# Patient Record
Sex: Male | Born: 1960
Health system: Southern US, Community
[De-identification: ages and names within clinical notes are randomized; demographics above are authoritative.]

## PROBLEM LIST (undated history)

## (undated) DIAGNOSIS — K603 Anal fistula: Secondary | ICD-10-CM

## (undated) DIAGNOSIS — Z7289 Other problems related to lifestyle: Secondary | ICD-10-CM

## (undated) DIAGNOSIS — R609 Edema, unspecified: Secondary | ICD-10-CM

## (undated) DIAGNOSIS — F1290 Cannabis use, unspecified, uncomplicated: Secondary | ICD-10-CM

## (undated) DIAGNOSIS — Z720 Tobacco use: Secondary | ICD-10-CM

## (undated) DIAGNOSIS — I10 Essential (primary) hypertension: Secondary | ICD-10-CM

## (undated) HISTORY — PX: HEMORRHOID SURGERY: SHX153

---
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
## 1997-01-01 DIAGNOSIS — K603 Anal fistula, unspecified: Secondary | ICD-10-CM

## 1997-01-01 HISTORY — DX: Anal fistula, unspecified: K60.30

## 1997-01-01 HISTORY — DX: Anal fistula: K60.3

## 2010-09-04 ENCOUNTER — Ambulatory Visit (INDEPENDENT_AMBULATORY_CARE_PROVIDER_SITE_OTHER): Payer: Self-pay

## 2010-09-04 ENCOUNTER — Inpatient Hospital Stay (HOSPITAL_COMMUNITY)
Admission: RE | Admit: 2010-09-04 | Discharge: 2010-09-04 | Disposition: A | Discharge status: Home / Self Care | Payer: Self-pay | Source: Ambulatory Visit | Attending: Family Medicine | Admitting: Family Medicine

## 2010-09-04 DIAGNOSIS — IMO0002 Reserved for concepts with insufficient information to code with codable children: Secondary | ICD-10-CM

## 2013-04-28 ENCOUNTER — Ambulatory Visit: Payer: Self-pay | Attending: Internal Medicine

## 2018-10-02 DIAGNOSIS — R609 Edema, unspecified: Secondary | ICD-10-CM

## 2018-10-02 DIAGNOSIS — F1090 Alcohol use, unspecified, uncomplicated: Secondary | ICD-10-CM

## 2018-10-02 DIAGNOSIS — Z7289 Other problems related to lifestyle: Secondary | ICD-10-CM

## 2018-10-02 DIAGNOSIS — F1290 Cannabis use, unspecified, uncomplicated: Secondary | ICD-10-CM

## 2018-10-02 DIAGNOSIS — R600 Localized edema: Secondary | ICD-10-CM

## 2018-10-02 DIAGNOSIS — I10 Essential (primary) hypertension: Secondary | ICD-10-CM

## 2018-10-02 DIAGNOSIS — Z789 Other specified health status: Secondary | ICD-10-CM

## 2018-10-02 DIAGNOSIS — Z720 Tobacco use: Secondary | ICD-10-CM

## 2018-10-02 HISTORY — DX: Alcohol use, unspecified, uncomplicated: F10.90

## 2018-10-02 HISTORY — DX: Other problems related to lifestyle: Z72.89

## 2018-10-02 HISTORY — DX: Other specified health status: Z78.9

## 2018-10-02 HISTORY — DX: Localized edema: R60.0

## 2018-10-02 HISTORY — DX: Edema, unspecified: R60.9

## 2018-10-02 HISTORY — DX: Essential (primary) hypertension: I10

## 2018-10-02 HISTORY — DX: Cannabis use, unspecified, uncomplicated: F12.90

## 2018-10-02 HISTORY — DX: Tobacco use: Z72.0

## 2018-10-06 ENCOUNTER — Other Ambulatory Visit: Payer: Self-pay

## 2018-10-06 ENCOUNTER — Encounter (HOSPITAL_COMMUNITY): Payer: Self-pay

## 2018-10-06 DIAGNOSIS — Y92009 Unspecified place in unspecified non-institutional (private) residence as the place of occurrence of the external cause: Secondary | ICD-10-CM

## 2018-10-06 DIAGNOSIS — K2970 Gastritis, unspecified, without bleeding: Secondary | ICD-10-CM | POA: Diagnosis present

## 2018-10-06 DIAGNOSIS — E860 Dehydration: Secondary | ICD-10-CM | POA: Diagnosis present

## 2018-10-06 DIAGNOSIS — F1020 Alcohol dependence, uncomplicated: Secondary | ICD-10-CM | POA: Diagnosis present

## 2018-10-06 DIAGNOSIS — T510X1A Toxic effect of ethanol, accidental (unintentional), initial encounter: Secondary | ICD-10-CM | POA: Diagnosis present

## 2018-10-06 DIAGNOSIS — D689 Coagulation defect, unspecified: Secondary | ICD-10-CM | POA: Diagnosis present

## 2018-10-06 DIAGNOSIS — T391X1A Poisoning by 4-Aminophenol derivatives, accidental (unintentional), initial encounter: Principal | ICD-10-CM | POA: Diagnosis present

## 2018-10-06 DIAGNOSIS — K7040 Alcoholic hepatic failure without coma: Secondary | ICD-10-CM | POA: Diagnosis present

## 2018-10-06 DIAGNOSIS — K7110 Toxic liver disease with hepatic necrosis, without coma: Secondary | ICD-10-CM | POA: Diagnosis present

## 2018-10-06 DIAGNOSIS — K029 Dental caries, unspecified: Secondary | ICD-10-CM | POA: Diagnosis present

## 2018-10-06 DIAGNOSIS — F1210 Cannabis abuse, uncomplicated: Secondary | ICD-10-CM | POA: Diagnosis present

## 2018-10-06 DIAGNOSIS — N179 Acute kidney failure, unspecified: Secondary | ICD-10-CM | POA: Diagnosis present

## 2018-10-06 DIAGNOSIS — I10 Essential (primary) hypertension: Secondary | ICD-10-CM | POA: Diagnosis present

## 2018-10-06 DIAGNOSIS — K920 Hematemesis: Secondary | ICD-10-CM | POA: Diagnosis present

## 2018-10-06 DIAGNOSIS — K047 Periapical abscess without sinus: Secondary | ICD-10-CM | POA: Diagnosis present

## 2018-10-06 DIAGNOSIS — Z20828 Contact with and (suspected) exposure to other viral communicable diseases: Secondary | ICD-10-CM | POA: Diagnosis present

## 2018-10-06 DIAGNOSIS — E872 Acidosis: Secondary | ICD-10-CM | POA: Diagnosis present

## 2018-10-06 NOTE — ED Triage Notes (Signed)
Pt arrived stating he has had an abscessed tooth for the last year but has not had it fixed due to cost, states it became worse last night and began vomiting today. Tooth broke off on Tuesday. Left lower jaw swelling, pain. ETOH on board, managing pain at home with tylenol

## 2018-10-07 ENCOUNTER — Encounter (HOSPITAL_COMMUNITY): Payer: Self-pay

## 2018-10-07 ENCOUNTER — Inpatient Hospital Stay (HOSPITAL_COMMUNITY)
Admission: EM | Admit: 2018-10-07 | Discharge: 2018-10-10 | DRG: 918 | Disposition: A | Discharge status: Home / Self Care | Payer: Self-pay | Attending: Internal Medicine | Admitting: Internal Medicine

## 2018-10-07 ENCOUNTER — Inpatient Hospital Stay (HOSPITAL_COMMUNITY): Payer: Self-pay

## 2018-10-07 ENCOUNTER — Telehealth (HOSPITAL_COMMUNITY): Payer: Self-pay | Admitting: Radiology

## 2018-10-07 DIAGNOSIS — R1013 Epigastric pain: Secondary | ICD-10-CM

## 2018-10-07 DIAGNOSIS — K7200 Acute and subacute hepatic failure without coma: Secondary | ICD-10-CM

## 2018-10-07 DIAGNOSIS — R7401 Elevation of levels of liver transaminase levels: Secondary | ICD-10-CM | POA: Diagnosis present

## 2018-10-07 DIAGNOSIS — F1910 Other psychoactive substance abuse, uncomplicated: Secondary | ICD-10-CM

## 2018-10-07 DIAGNOSIS — T391X1A Poisoning by 4-Aminophenol derivatives, accidental (unintentional), initial encounter: Secondary | ICD-10-CM

## 2018-10-07 DIAGNOSIS — K047 Periapical abscess without sinus: Secondary | ICD-10-CM

## 2018-10-07 LAB — COMPREHENSIVE METABOLIC PANEL
ALT: 10188 U/L — ABNORMAL HIGH (ref 0–44)
ALT: 8773 U/L — ABNORMAL HIGH (ref 0–44)
ALT: 8927 U/L — ABNORMAL HIGH (ref 0–44)
AST: >10000 U/L — ABNORMAL HIGH (ref 15–41)
AST: 9128 U/L — ABNORMAL HIGH (ref 15–41)
AST: 8982 U/L — ABNORMAL HIGH (ref 15–41)
Albumin: 3.9 g/dL (ref 3.5–5.0)
Albumin: 2.9 g/dL — ABNORMAL LOW (ref 3.5–5.0)
Albumin: 3.2 g/dL — ABNORMAL LOW (ref 3.5–5.0)
Alkaline Phosphatase: 127 U/L — ABNORMAL HIGH (ref 38–126)
Alkaline Phosphatase: 93 U/L (ref 38–126)
Alkaline Phosphatase: 93 U/L (ref 38–126)
Anion gap: 13 (ref 5–15)
Anion gap: 19 — ABNORMAL HIGH (ref 5–15)
Anion gap: 12 (ref 5–15)
BUN: 35 mg/dL — ABNORMAL HIGH (ref 6–20)
BUN: 35 mg/dL — ABNORMAL HIGH (ref 6–20)
BUN: 37 mg/dL — ABNORMAL HIGH (ref 6–20)
CO2: 24 mmol/L (ref 22–32)
CO2: 16 mmol/L — ABNORMAL LOW (ref 22–32)
CO2: 23 mmol/L (ref 22–32)
Calcium: 10.7 mg/dL — ABNORMAL HIGH (ref 8.9–10.3)
Calcium: 8.8 mg/dL — ABNORMAL LOW (ref 8.9–10.3)
Calcium: 9 mg/dL (ref 8.9–10.3)
Chloride: 100 mmol/L (ref 98–111)
Chloride: 103 mmol/L (ref 98–111)
Chloride: 102 mmol/L (ref 98–111)
Creatinine, Ser: 2.21 mg/dL — ABNORMAL HIGH (ref 0.61–1.24)
Creatinine, Ser: 2.1 mg/dL — ABNORMAL HIGH (ref 0.61–1.24)
Creatinine, Ser: 1.99 mg/dL — ABNORMAL HIGH (ref 0.61–1.24)
GFR calc Af Amer: 37 mL/min — ABNORMAL LOW (ref >60)
GFR calc Af Amer: 40 mL/min — ABNORMAL LOW (ref >60)
GFR calc Af Amer: 42 mL/min — ABNORMAL LOW (ref >60)
GFR calc non Af Amer: 32 mL/min — ABNORMAL LOW (ref >60)
GFR calc non Af Amer: 34 mL/min — ABNORMAL LOW (ref >60)
GFR calc non Af Amer: 36 mL/min — ABNORMAL LOW (ref >60)
Glucose, Bld: 88 mg/dL (ref 70–99)
Glucose, Bld: 119 mg/dL — ABNORMAL HIGH (ref 70–99)
Glucose, Bld: 92 mg/dL (ref 70–99)
Potassium: 4.6 mmol/L (ref 3.5–5.1)
Potassium: 4.3 mmol/L (ref 3.5–5.1)
Potassium: 4 mmol/L (ref 3.5–5.1)
Sodium: 137 mmol/L (ref 135–145)
Sodium: 138 mmol/L (ref 135–145)
Sodium: 137 mmol/L (ref 135–145)
Total Bilirubin: 4.9 mg/dL — ABNORMAL HIGH (ref 0.3–1.2)
Total Bilirubin: 3.8 mg/dL — ABNORMAL HIGH (ref 0.3–1.2)
Total Bilirubin: 3.8 mg/dL — ABNORMAL HIGH (ref 0.3–1.2)
Total Protein: 7.1 g/dL (ref 6.5–8.1)
Total Protein: 5.7 g/dL — ABNORMAL LOW (ref 6.5–8.1)
Total Protein: 5.9 g/dL — ABNORMAL LOW (ref 6.5–8.1)

## 2018-10-07 LAB — URINALYSIS, ROUTINE W REFLEX MICROSCOPIC
Glucose, UA: NEGATIVE mg/dL
Ketones, ur: NEGATIVE mg/dL
Leukocytes,Ua: NEGATIVE
Nitrite: NEGATIVE
Protein, ur: 100 mg/dL — AB
Specific Gravity, Urine: 1.02 (ref 1.005–1.030)
pH: 5 (ref 5.0–8.0)

## 2018-10-07 LAB — CBC
HCT: 51.3 % (ref 39.0–52.0)
HCT: 44.4 % (ref 39.0–52.0)
Hemoglobin: 17.2 g/dL — ABNORMAL HIGH (ref 13.0–17.0)
Hemoglobin: 14.8 g/dL (ref 13.0–17.0)
MCH: 32.1 pg (ref 26.0–34.0)
MCH: 32.2 pg (ref 26.0–34.0)
MCHC: 33.5 g/dL (ref 30.0–36.0)
MCHC: 33.3 g/dL (ref 30.0–36.0)
MCV: 95.9 fL (ref 80.0–100.0)
MCV: 96.5 fL (ref 80.0–100.0)
Platelets: 188 10*3/uL (ref 150–400)
Platelets: 164 10*3/uL (ref 150–400)
RBC: 5.35 MIL/uL (ref 4.22–5.81)
RBC: 4.6 MIL/uL (ref 4.22–5.81)
RDW: 12.3 % (ref 11.5–15.5)
RDW: 12.3 % (ref 11.5–15.5)
WBC: 14 10*3/uL — ABNORMAL HIGH (ref 4.0–10.5)
WBC: 11.4 10*3/uL — ABNORMAL HIGH (ref 4.0–10.5)
nRBC: 0 % (ref 0.0–0.2)
nRBC: 0 % (ref 0.0–0.2)

## 2018-10-07 LAB — BLOOD GAS, VENOUS
Acid-base deficit: 0.8 mmol/L (ref 0.0–2.0)
Bicarbonate: 23.7 mmol/L (ref 20.0–28.0)
O2 Saturation: 89.5 %
Patient temperature: 98.6
pCO2, Ven: 40.9 mmHg — ABNORMAL LOW (ref 44.0–60.0)
pH, Ven: 7.381 (ref 7.250–7.430)
pO2, Ven: 61.8 mmHg — ABNORMAL HIGH (ref 32.0–45.0)

## 2018-10-07 LAB — PHOSPHORUS: Phosphorus: 2.6 mg/dL (ref 2.5–4.6)

## 2018-10-07 LAB — MAGNESIUM: Magnesium: 1.8 mg/dL (ref 1.7–2.4)

## 2018-10-07 LAB — PROTIME-INR
INR: 2.8 — ABNORMAL HIGH (ref 0.8–1.2)
INR: 2.6 — ABNORMAL HIGH (ref 0.8–1.2)
Prothrombin Time: 29.1 seconds — ABNORMAL HIGH (ref 11.4–15.2)
Prothrombin Time: 27.4 seconds — ABNORMAL HIGH (ref 11.4–15.2)

## 2018-10-07 LAB — HEPATITIS PANEL, ACUTE
HCV Ab: NONREACTIVE
Hep A IgM: NONREACTIVE
Hep B C IgM: NONREACTIVE
Hepatitis B Surface Ag: NONREACTIVE

## 2018-10-07 LAB — RAPID URINE DRUG SCREEN, HOSP PERFORMED
Amphetamines: NOT DETECTED
Barbiturates: NOT DETECTED
Benzodiazepines: NOT DETECTED
Cocaine: NOT DETECTED
Opiates: NOT DETECTED
Tetrahydrocannabinol: POSITIVE — AB

## 2018-10-07 LAB — AMMONIA
Ammonia: 83 umol/L — ABNORMAL HIGH (ref 9–35)
Ammonia: 59 umol/L — ABNORMAL HIGH (ref 9–35)

## 2018-10-07 LAB — URINE CULTURE: Culture: <10000 — AB

## 2018-10-07 LAB — ETHANOL: Alcohol, Ethyl (B): 10 mg/dL — ABNORMAL HIGH (ref <10)

## 2018-10-07 LAB — LIPASE, BLOOD: Lipase: 652 U/L — ABNORMAL HIGH (ref 11–51)

## 2018-10-07 LAB — MRSA PCR SCREENING: MRSA by PCR: NEGATIVE

## 2018-10-07 LAB — SARS CORONAVIRUS 2 BY RT PCR (HOSPITAL ORDER, PERFORMED IN ~~LOC~~ HOSPITAL LAB): SARS Coronavirus 2: NEGATIVE

## 2018-10-07 LAB — HIV ANTIBODY (ROUTINE TESTING W REFLEX): HIV Screen 4th Generation wRfx: NONREACTIVE

## 2018-10-07 LAB — LACTIC ACID, PLASMA: Lactic Acid, Venous: 2.2 mmol/L (ref 0.5–1.9)

## 2018-10-07 LAB — SALICYLATE LEVEL: Salicylate Lvl: 7.4 mg/dL (ref 2.8–30.0)

## 2018-10-07 LAB — ACETAMINOPHEN LEVEL: Acetaminophen (Tylenol), Serum: <10 ug/mL — ABNORMAL LOW (ref 10–30)

## 2018-10-07 MED ORDER — SODIUM CHLORIDE 0.9 % IV SOLN
1.0000 g | Freq: Once | INTRAVENOUS | Status: AC
  Administered 2018-10-07: 06:00:00 1 g via INTRAVENOUS
  Filled 2018-10-07: qty 10

## 2018-10-07 MED ORDER — DEXTROSE 5 % IV SOLN
15.0000 mg/kg/h | INTRAVENOUS | Status: DC
  Filled 2018-10-07: qty 120

## 2018-10-07 MED ORDER — AMLODIPINE BESYLATE 5 MG PO TABS
5.0000 mg | ORAL_TABLET | Freq: Every day | ORAL | Status: DC
  Administered 2018-10-07 – 2018-10-10 (×4): 5 mg via ORAL
  Filled 2018-10-07 (×4): qty 1

## 2018-10-07 MED ORDER — DEXTROSE 5 % IV SOLN
15.0000 mg/kg/h | INTRAVENOUS | Status: DC
  Administered 2018-10-07 – 2018-10-08 (×2): 15 mg/kg/h via INTRAVENOUS
  Filled 2018-10-07 (×4): qty 210

## 2018-10-07 MED ORDER — LIDOCAINE VISCOUS HCL 2 % MT SOLN
15.0000 mL | Freq: Once | OROMUCOSAL | Status: AC
  Administered 2018-10-07: 02:00:00 15 mL via OROMUCOSAL
  Filled 2018-10-07: qty 15

## 2018-10-07 MED ORDER — LORAZEPAM 2 MG/ML IJ SOLN: 1.0000 mg | INTRAMUSCULAR | Status: DC | PRN

## 2018-10-07 MED ORDER — ACETYLCYSTEINE LOAD VIA INFUSION: 150.0000 mg/kg | Freq: Once | INTRAVENOUS | Status: DC

## 2018-10-07 MED ORDER — AMOXICILLIN-POT CLAVULANATE 875-125 MG PO TABS
1.0000 | ORAL_TABLET | Freq: Once | ORAL | Status: AC
  Administered 2018-10-07: 02:00:00 1 via ORAL
  Filled 2018-10-07: qty 1

## 2018-10-07 MED ORDER — ACETYLCYSTEINE LOAD VIA INFUSION
150.0000 mg/kg | Freq: Once | INTRAVENOUS | Status: AC
  Administered 2018-10-07: 03:00:00 14220 mg via INTRAVENOUS
  Filled 2018-10-07: qty 356

## 2018-10-07 MED ORDER — ONDANSETRON 4 MG PO TBDP
4.0000 mg | ORAL_TABLET | Freq: Once | ORAL | Status: AC
  Administered 2018-10-07: 02:00:00 4 mg via ORAL
  Filled 2018-10-07: qty 1

## 2018-10-07 MED ORDER — SODIUM CHLORIDE 0.9 % IV SOLN: 1000.0000 mL | INTRAVENOUS | Status: DC

## 2018-10-07 MED ORDER — PROMETHAZINE HCL 25 MG/ML IJ SOLN
INTRAMUSCULAR | Status: AC
  Filled 2018-10-07: qty 1

## 2018-10-07 MED ORDER — ONDANSETRON HCL 4 MG/2ML IJ SOLN
4.0000 mg | Freq: Once | INTRAMUSCULAR | Status: AC
  Administered 2018-10-07: 05:00:00 4 mg via INTRAVENOUS
  Filled 2018-10-07: qty 2

## 2018-10-07 MED ORDER — PANTOPRAZOLE SODIUM 40 MG IV SOLR
40.0000 mg | Freq: Two times a day (BID) | INTRAVENOUS | Status: DC
  Administered 2018-10-07 – 2018-10-09 (×5): 40 mg via INTRAVENOUS
  Filled 2018-10-07 (×4): qty 40

## 2018-10-07 MED ORDER — SODIUM CHLORIDE 0.9 % IV BOLUS (SEPSIS)
1000.0000 mL | Freq: Once | INTRAVENOUS | Status: AC
  Administered 2018-10-07: 04:00:00 1000 mL via INTRAVENOUS

## 2018-10-07 MED ORDER — MORPHINE SULFATE (PF) 4 MG/ML IV SOLN
4.0000 mg | Freq: Once | INTRAVENOUS | Status: AC
  Administered 2018-10-07: 03:00:00 4 mg via INTRAVENOUS
  Filled 2018-10-07: qty 1

## 2018-10-07 MED ORDER — ONDANSETRON HCL 4 MG/2ML IJ SOLN: 4.0000 mg | Freq: Four times a day (QID) | INTRAMUSCULAR | Status: DC | PRN

## 2018-10-07 MED ORDER — LACTATED RINGERS IV SOLN
INTRAVENOUS | Status: DC
  Administered 2018-10-07 – 2018-10-09 (×4): via INTRAVENOUS

## 2018-10-07 MED ORDER — SODIUM CHLORIDE 0.9 % IV BOLUS
1000.0000 mL | Freq: Once | INTRAVENOUS | Status: AC
  Administered 2018-10-07: 03:00:00 1000 mL via INTRAVENOUS

## 2018-10-07 MED ORDER — HALOPERIDOL LACTATE 5 MG/ML IJ SOLN
2.0000 mg | Freq: Once | INTRAMUSCULAR | Status: DC
  Filled 2018-10-07: qty 1

## 2018-10-07 MED ORDER — METHYLPREDNISOLONE SODIUM SUCC 40 MG IJ SOLR
40.0000 mg | INTRAMUSCULAR | Status: DC
  Administered 2018-10-07 – 2018-10-08 (×2): 40 mg via INTRAVENOUS
  Filled 2018-10-07 (×2): qty 1

## 2018-10-07 MED ORDER — ORAL CARE MOUTH RINSE
15.0000 mL | Freq: Two times a day (BID) | OROMUCOSAL | Status: DC
  Administered 2018-10-07 – 2018-10-09 (×5): 15 mL via OROMUCOSAL

## 2018-10-07 MED ORDER — CHLORHEXIDINE GLUCONATE CLOTH 2 % EX PADS
6.0000 | MEDICATED_PAD | Freq: Every day | CUTANEOUS | Status: DC
  Administered 2018-10-08 – 2018-10-09 (×2): 6 via TOPICAL

## 2018-10-07 MED ORDER — PROMETHAZINE HCL 25 MG/ML IJ SOLN
25.0000 mg | Freq: Once | INTRAMUSCULAR | Status: AC
  Administered 2018-10-07: 25 mg via INTRAVENOUS

## 2018-10-07 MED ORDER — LIDOCAINE VISCOUS HCL 2 % MT SOLN
15.0000 mL | Freq: Once | OROMUCOSAL | Status: AC
  Administered 2018-10-07: 23:00:00 15 mL via OROMUCOSAL
  Filled 2018-10-07: qty 15

## 2018-10-07 MED ORDER — SODIUM CHLORIDE 0.9 % IV SOLN
250.0000 mL | INTRAVENOUS | Status: DC
  Administered 2018-10-07: 07:00:00 250 mL via INTRAVENOUS

## 2018-10-07 MED ORDER — AMOXICILLIN-POT CLAVULANATE 875-125 MG PO TABS
1.0000 | ORAL_TABLET | Freq: Two times a day (BID) | ORAL | Status: DC
  Administered 2018-10-07 – 2018-10-10 (×7): 1 via ORAL
  Filled 2018-10-07 (×7): qty 1

## 2018-10-07 MED ORDER — PANTOPRAZOLE SODIUM 40 MG IV SOLR
40.0000 mg | Freq: Once | INTRAVENOUS | Status: AC
  Administered 2018-10-07: 05:00:00 40 mg via INTRAVENOUS
  Filled 2018-10-07: qty 40

## 2018-10-07 MED ORDER — VITAMIN K1 10 MG/ML IJ SOLN
10.0000 mg | Freq: Once | INTRAVENOUS | Status: AC
  Administered 2018-10-07: 10 mg via INTRAVENOUS
  Filled 2018-10-07: qty 1

## 2018-10-07 NOTE — ED Provider Notes (Addendum)
[facility]-EMERGENCY DEPT Provider Note   CSN: [id number] Arrival date & time: 10/06/18  2224     History   Chief Complaint Chief Complaint  Patient presents with   Dental Pain    HPI Richard George is a 58 y.o. male presenting for evaluation of tooth pain and swelling.  Patient states since Thursday, he has had worsening tooth pain.  Due to his pain, he took excessive amounts of Tylenol, states 50 to 70 tablets of residual..  He has not had any Tylenol in the past 24 hours, as he started vomiting.  Patient states he has had some ibuprofen, but has not taken very much.  He also reports drinking some alcohol, but does not quantify how much.  Patient reports lots of pain in his tooth, which is causing headaches.  He also reports nausea and vomiting, unable to quantify how much.  He has vomited twice since being in the ER.  Emesis is nonbloody, and not coffee-ground.  Patient reports fever yesterday of 101, unknown if he has a fever today.  He denies difficulty swallowing or difficulty opening his mouth.  He denies cough, chest pain, abdominal pain, urinary symptoms, normal bowel movements.  He has no medical problems, takes no medications daily.     HPI  History reviewed. No pertinent past medical history.  There are no active problems to display for this patient.   History reviewed. No pertinent surgical history.      Home Medications    Prior to Admission medications   Medication Sig Start Date End Date Taking? Authorizing Provider  acetaminophen (TYLENOL) 500 MG tablet Take 1,000 mg by mouth every 6 (six) hours as needed for mild pain, moderate pain or headache.   Yes [provider]    Family History No family history on file.  Social History Social History   Tobacco Use   Smoking status: Not on file  Substance Use Topics   Alcohol use: Not on file   Drug use: Not on file     Allergies   Patient has no known  allergies.   Review of Systems Review of Systems  Constitutional: Positive for fever (yesterday).  HENT: Positive for dental problem and facial swelling.   Gastrointestinal: Positive for nausea and vomiting.  All other systems reviewed and are negative.    Physical Exam Updated Vital Signs BP (!) 157/108    Pulse (!) 103    Temp 97.9 F (36.6 C) (Oral)    Resp (!) 29    Ht  (1.803 m)    Wt 94.8 kg    SpO2 97%    BMI 29.15 kg/m   Physical Exam Vitals signs and nursing note reviewed.  Constitutional:      General: He is not in acute distress.    Appearance: He is well-developed.     Comments: Appears ill.  HENT:     Head: Normocephalic and atraumatic.      Comments: Left-sided facial swelling due to dental infection.    Mouth/Throat:     Lips: Pink.     Dentition: Abnormal dentition. Dental tenderness and dental caries present.      Comments: Overall poor dentition.  Left lower tooth cracked with surrounding gum erythema and edema.  No obvious abscess.  No trismus.  No pain under the tongue.  No swelling extending beyond the ramus of the mandible. Eyes:     Extraocular Movements: Extraocular movements intact.     Conjunctiva/sclera: Conjunctivae  normal.     Pupils: Pupils are equal, round, and reactive to light.  Neck:     Musculoskeletal: Normal range of motion and neck supple.  Cardiovascular:     Rate and Rhythm: Normal rate and regular rhythm.     Pulses: Normal pulses.  Pulmonary:     Effort: Pulmonary effort is normal. No respiratory distress.     Breath sounds: Normal breath sounds. No wheezing.  Abdominal:     General: There is no distension.     Palpations: Abdomen is soft. There is no mass.     Tenderness: There is no abdominal tenderness. There is no guarding or rebound.     Comments: No ttp of the abd. No rigidity, guarding, or distention. Negative rebound.   Musculoskeletal: Normal range of motion.  Skin:    General: Skin is warm and dry.      Capillary Refill: Capillary refill takes less than 2 seconds.  Neurological:     Mental Status: He is alert and oriented to person, place, and time.      ED Treatments / Results  Labs (all labs ordered are listed, but only abnormal results are displayed) Labs Reviewed  COMPREHENSIVE METABOLIC PANEL - Abnormal; Notable for the following components:      Result Value   BUN 35 (*)    Creatinine, Ser 2.21 (*)    Calcium 10.7 (*)    AST >10,000 (*)    ALT 10,188 (*)    Alkaline Phosphatase 127 (*)    Total Bilirubin 4.9 (*)    GFR calc non Af Amer 32 (*)    GFR calc Af Amer 37 (*)    All other components within normal limits  CBC - Abnormal; Notable for the following components:   WBC 14.0 (*)    Hemoglobin 17.2 (*)    All other components within normal limits  PROTIME-INR - Abnormal; Notable for the following components:   Prothrombin Time 29.1 (*)    INR 2.8 (*)    All other components within normal limits  RAPID URINE DRUG SCREEN, HOSP PERFORMED - Abnormal; Notable for the following components:   Tetrahydrocannabinol POSITIVE (*)    All other components within normal limits  ETHANOL - Abnormal; Notable for the following components:   Alcohol, Ethyl (B) 10 (*)    All other components within normal limits  ACETAMINOPHEN LEVEL - Abnormal; Notable for the following components:   Acetaminophen (Tylenol), Serum <10 (*)    All other components within normal limits  URINALYSIS, ROUTINE W REFLEX MICROSCOPIC - Abnormal; Notable for the following components:   Color, Urine AMBER (*)    APPearance CLOUDY (*)    Hgb urine dipstick MODERATE (*)    Bilirubin Urine SMALL (*)    Protein, ur 100 (*)    Bacteria, UA FEW (*)    All other components within normal limits  BLOOD GAS, VENOUS - Abnormal; Notable for the following components:   pCO2, Ven 40.9 (*)    pO2, Ven 61.8 (*)    All other components within normal limits  AMMONIA - Abnormal; Notable for the following components:    Ammonia 83 (*)    All other components within normal limits  SARS CORONAVIRUS 2 (HOSPITAL ORDER, [facility])  URINE CULTURE  SALICYLATE LEVEL    EKG EKG Interpretation  Date/Time:  Tuesday October 07 2018 02:47:50 EDT Ventricular Rate:  86 PR Interval:    QRS Duration: 93 QT Interval:  369 QTC Calculation: 442 R Axis:   72 Text Interpretation:  Sinus rhythm Probable left atrial enlargement Confirmed by Nicanor Alcon, April ([id number]) on 10/07/2018 2:51:41 AM   Radiology No results found.  Procedures .Critical Care Performed by: Alveria Apley, PA-C Authorized by: Alveria Apley, PA-C   Critical care provider statement:    Critical care time (minutes):  75   Critical care time was exclusive of:  Separately billable procedures and treating other patients and teaching time   Critical care was necessary to treat or prevent imminent or life-threatening deterioration of the following conditions:  Hepatic failure   Critical care was time spent personally by me on the following activities:  Blood draw for specimens, development of treatment plan with patient or surrogate, discussions with consultants, evaluation of patient's response to treatment, examination of patient, obtaining history from patient or surrogate, ordering and performing treatments and interventions, ordering and review of laboratory studies, ordering and review of radiographic studies, pulse oximetry, re-evaluation of patient's condition and review of old charts   I assumed direction of critical care for this patient from another provider in my specialty: no   Comments:     Pt with shock liver due to tylenol OD. NAC started. Vitamin K started. Pt admitted to critical care.    (including critical care time)  Medications Ordered in ED Medications  promethazine (PHENERGAN) 25 MG/ML injection (has no administration in time range)  phytonadione (VITAMIN K) 10 mg in dextrose 5 % 50 mL IVPB (10 mg  Intravenous New Bag/Given 10/07/18 0506)  sodium chloride 0.9 % bolus 1,000 mL (1,000 mLs Intravenous New Bag/Given 10/07/18 0428)    Followed by  0.9 %  sodium chloride infusion (has no administration in time range)  cefTRIAXone (ROCEPHIN) 1 g in sodium chloride 0.9 % 100 mL IVPB (has no administration in time range)  haloperidol lactate (HALDOL) injection 2 mg (has no administration in time range)  acetylcysteine (ACETADOTE) 42,000 mg in dextrose 5 % 1,050 mL (40 mg/mL) infusion (has no administration in time range)  amoxicillin-clavulanate (AUGMENTIN) 875-125 MG per tablet 1 tablet (1 tablet Oral Given 10/07/18 0134)  lidocaine (XYLOCAINE) 2 % viscous mouth solution 15 mL (15 mLs Mouth/Throat Given 10/07/18 0134)  ondansetron (ZOFRAN-ODT) disintegrating tablet 4 mg (4 mg Oral Given 10/07/18 0134)  acetylcysteine (ACETADOTE) 40 mg/mL load via infusion 14,220 mg (14,220 mg Intravenous Bolus from Bag 10/07/18 0252)  sodium chloride 0.9 % bolus 1,000 mL (0 mLs Intravenous Stopped 10/07/18 0425)  promethazine (PHENERGAN) injection 25 mg (25 mg Intravenous Given 10/07/18 0325)  morphine 4 MG/ML injection 4 mg (4 mg Intravenous Given 10/07/18 0325)  pantoprazole (PROTONIX) injection 40 mg (40 mg Intravenous Given 10/07/18 0444)  ondansetron (ZOFRAN) injection 4 mg (4 mg Intravenous Given 10/07/18 0443)     Initial Impression / Assessment and Plan / ED Course  I have reviewed the triage vital signs and the nursing notes.  Pertinent labs & imaging results that were available during my care of the patient were reviewed by me and considered in my medical decision making (see chart for details).        Patient presenting to the emergency department for dental pain.  Unfortunately, patient reports that he took excessive amounts of Tylenol, at least 70 pills in the past several days.  Patient reports nausea and vomiting over the past 24 hours.  I am concerned for liver issues in the setting of Tylenol  overdose.  Patient's dental pain not consistent with Ludwig's, does  not appear to be emergent at this time, as such will focus on tylenol OD tx/workup. Labs ordered for liver eval. Will consult with poison control.   Discussed with poison control who recommended starting NAC prior to Tylenol and liver values.  If both are normal, can consider discontinuing treatment.  Labs concerning for liver failure, AST and ALT greater than 10,000.  Bili 4.9.  Patient also with presumed AKI with a creatinine of 2.2, although no baseline.  INR also consistent with liver abnormalities, elevated at 2.8.  Will consult with GI and admit to critical care.  Discussed with Dr. Bosie ClosSchooler from GI who will consult on the patient.  Discussed with Dr. Levada SchillingSummers from critical care, PCCM will evaluate and admit the patient.  Unfortunately due to patient's alcohol abuse, not a candidate for liver transplant.   Discussed with poison control who recommends starting vitamin K for elevated INR and aggressive hydration.   Informed by RN that pt is more confused. I assessed pt, who is still alert and oriented, however he is vomiting dark blood-like emesis. protonix started. Haldol and zofran for nausea, as pt has had phenergan and zofran odt. Also consider etoh withdrawal complicating the picture in the setting of daily alcohol use.   Informed critical care of pt's worsening status, no change in plan. Pt's mom contacted and informed of pt's condition.   Discussed plan for admission with pt and mom. Pt's mom to call pt's children.  Final Clinical Impressions(s) / ED Diagnoses   Final diagnoses:  Accidental acetaminophen overdose, initial encounter  Acute liver failure without hepatic coma    ED Discharge Orders    None       Alveria ApleyCaccavale, Ulrich Soules, PA-C 10/07/18 0648    Alveria Apleyaccavale, Barton Want, PA-C 10/07/18 0650    Palumbo, April, MD 10/07/18 [phone]

## 2018-10-07 NOTE — ED Notes (Signed)
Date and time results received: 10/07/18 0119 (use smartphrase ".now" to insert current time)  Test: Lactic acid Critical Value: 2.2  Name of Provider Notified:  Orders Received? Or Actions Taken?: waiting on orders

## 2018-10-07 NOTE — Telephone Encounter (Signed)
Received call from pt's daughter multiple times today with concerns of being able to see her father. The family received a call from the ED stating that the patient was very ill and may not make it. They were advised to come to the hospital. The mother of the pt and two daughters's came to the hospital.The mother went in to see pt. They were told they could go in one at a time. They did that. At one point when 1 daughter was coming out to let the other daughter go in she was told, no they could not do that. They let the guy know at the front doing the COVID screening Trilby Drummer) that they could no longer do that and the person helping him at the [place] screening desk said that the nurse had said they could do that due to the pt's situation. Then Trilby Drummer stated, why what is so good about them that they get to do that, is he dead or what? This was said directly in front of both daughters as well as the other's in the lobby. The pt's daughter is extremely upset with the treatment they are receiving from the staff, understandably so. I told her that I would try to relay this information to the appropriate staff to see if it can be corrected ASAP. JM

## 2018-10-07 NOTE — Consult Note (Signed)
Subjective:   HPI  The patient is a 58 year old male who we are asked to see in consultation.  He presented to the emergency department at [facility] at around 11:00 last evening.  The patient states that last Thursday which would have been 5 days ago he started to have a really bad toothache.  He started taking Tylenol for the toothache.  He would take 6 or 8 at a time.  He did this up until Sunday and therefore he was taking excessive Tylenol over a 4-day period.  When I asked him exactly how much he took he could not tell me for sure but states it was a lot, and he kept taking it because it was not helping the pain.  He has a history of excessive alcohol use and has chronically drank a 9 pack of light beer for a long time.  He states he has not had any alcohol however in the past 6 days because he could not keep it down.  He has had some vomiting.  He states that mentally he has been clear.  Labs obtained here in the emergency room show WBCs 11.4, H&H 14.8 and 44.4 respectively.  Total bilirubin 3.8, alkaline phosphatase 93, AST 9128, ALT 8773, ammonia 83, prothrombin time 29.1, INR 2.8, EtOH level greater than 10.  Upon presentation to the emergency room it was felt that this patient had acute liver injury.  Given the history of excessive Tylenol ingestion acetylcysteine protocol was instituted.  Review of Systems No chest pain or shortness of breath History reviewed. No pertinent past medical history. History reviewed. No pertinent surgical history. Social History   Socioeconomic History  . Marital status: Divorced    Spouse name: Not on file  . Number of children: Not on file  . Years of education: Not on file  . Highest education level: Not on file  Occupational History  . Not on file  Social Needs  . Financial resource strain: Not on file  . Food insecurity    Worry: Not on file    Inability: Not on file  . Transportation needs    Medical: Not on file    Non-medical:  Not on file  Tobacco Use  . Smoking status: Not on file  Substance and Sexual Activity  . Alcohol use: Not on file  . Drug use: Not on file  . Sexual activity: Not on file  Lifestyle  . Physical activity    Days per week: Not on file    Minutes per session: Not on file  . Stress: Not on file  Relationships  . Social Herbalist on phone: Not on file    Gets together: Not on file    Attends religious service: Not on file    Active member of club or organization: Not on file    Attends meetings of clubs or organizations: Not on file    Relationship status: Not on file  . Intimate partner violence    Fear of current or ex partner: Not on file    Emotionally abused: Not on file    Physically abused: Not on file    Forced sexual activity: Not on file  Other Topics Concern  . Not on file  Social History Narrative  . Not on file   family history is not on file.  Current Facility-Administered Medications:  .  0.9 %  sodium chloride infusion, 250 mL, Intravenous, Continuous, Collier Bullock, MD, Last Rate: 15  mL/hr at 10/07/18 0658, 250 mL at 10/07/18 0658 .  acetylcysteine (ACETADOTE) 42,000 mg in dextrose 5 % 1,050 mL (40 mg/mL) infusion, 15 mg/kg/hr, Intravenous, Continuous, Palumbo, April, MD, Last Rate: 35.6 mL/hr at 10/07/18 0652, 15 mg/kg/hr at 10/07/18 6256 .  haloperidol lactate (HALDOL) injection 2 mg, 2 mg, Intravenous, Once, Caccavale, Sophia, PA-C .  LORazepam (ATIVAN) injection 1-2 mg, 1-2 mg, Intravenous, Q1H PRN, Charlotte Sanes, MD .  ondansetron [facility]) injection 4 mg, 4 mg, Intravenous, Q6H PRN, Charlotte Sanes, MD .  pantoprazole (PROTONIX) injection 40 mg, 40 mg, Intravenous, Q12H, Charlotte Sanes, MD  Current Outpatient Medications:  .  acetaminophen (TYLENOL) 500 MG tablet, Take 1,000 mg by mouth every 6 (six) hours as needed for mild pain, moderate pain or headache., Disp: , Rfl:  No Known Allergies   Objective:     BP (!) 144/107   Pulse 94    Temp 97.9 F (36.6 C) (Oral)   Resp 18   Ht 5\' 11"  (1.803 m)   Wt 94.8 kg   SpO2 98%   BMI 29.15 kg/m   He is somewhat jittery, but in no acute distress.  Mentally alert  Skin and sclera slightly icteric.  No spider angiomata seen on skin.  Heart regular rhythm no murmurs  Lungs clear  Abdomen: Bowel sounds present, soft, nontender, no hepatomegaly noted on exam or splenomegaly, and no pain in the right upper quadrant.  An abdominal ultrasound was done showing small amount of pericholecystic fluid otherwise unremarkable.  Normal sonographic appearance of the liver.  Laboratory No components found for: D1    Assessment:     Acute liver injury secondary to combination of alcohol and acetaminophen.      Plan:     Admit to critical care.  Supportive care.  Continue acetylcysteine protocol.  Avoidance of alcohol will be critical going forward.  Monitor LFTs and clinical course.  Watch for DTs.  Watch for hepatic encephalopathy, but I do not see any signs of either at this time.  Will check hepatitis panel.  He has several tattoos on his body.  Rule out underlying hepatitis C versus other viral hepatitides.

## 2018-10-07 NOTE — ED Notes (Signed)
Lab called back and said ammonia has to be on ice. I will recollect.

## 2018-10-07 NOTE — ED Notes (Signed)
Called lab and requested they add on ammonia and urine micro. They said they could

## 2018-10-07 NOTE — Progress Notes (Signed)
Day intensivist note  Seen and examined in ER. Appears nontoxic. Portal vein patent on [place]  A: Acute liver injury some combination of APAP and EtOH Acute kidney injury Anion gap acidosis  P: - Supportive care, NAC - Trend INR/LFTs - Check lactic acid, lipase - Start LR@ 125 cc/hr - f/u acute hepatitis panel - Diet when feels up to it - Will start steroids since no real downside, discriminant score 82, switch to PO when able to take  Erskine Emery MD

## 2018-10-07 NOTE — Progress Notes (Signed)
MEDICATION RELATED CONSULT NOTE   Pharmacy Consult for Acetylcysteine Indication: Recent Tylenol use, chronic ETOH abuse  No Known Allergies  Patient Measurements: Height: 5\' 11"  (180.3 cm) Weight: 209 lb (94.8 kg) IBW/kg (Calculated) : 75.3  Vital Signs: Temp: 97.9 F (36.6 C) (10/06 0340) Temp Source: Oral (10/06 0340) BP: 136/84 (10/06 1145) Pulse Rate: 83 (10/06 1145) Intake/Output from previous day: 10/05 0701 - 10/06 0700 In: 2038 [IV Piggyback:2038] Out: -  Intake/Output from this shift: No intake/output data recorded.  Labs: Recent Labs    10/07/18 0116 10/07/18 0642  WBC 14.0* 11.4*  HGB 17.2* 14.8  HCT 51.3 44.4  PLT 188 164  CREATININE 2.21* 2.10*  MG  --  1.8  PHOS  --  2.6  ALBUMIN 3.9 2.9*  PROT 7.1 5.7*  AST >10,000* 9,128*  ALT 10,188* 8,773*  ALKPHOS 127* 93  BILITOT 4.9* 3.8*   Estimated Creatinine Clearance: 46.2 mL/min (A) (by C-G formula based on SCr of 2.1 mg/dL (H)).  Medical History: History reviewed. No pertinent past medical history.  Medications:  Scheduled:  . haloperidol lactate  2 mg Intravenous Once  . methylPREDNISolone (SOLU-MEDROL) injection  40 mg Intravenous Q24H  . pantoprazole (PROTONIX) IV  40 mg Intravenous Q12H   Infusions:  . sodium chloride 250 mL (10/07/18 0658)  . acetylcysteine 15 mg/kg/hr (10/07/18 3762)  . lactated ringers 125 mL/hr at 10/07/18 1033   Assessment: Started Acetylcysteine 10/7 am in ED CPC updated with labs---no new orders, est. > 24hr LOT  ** entire 1st bag infused at bolus rate, started 0300. Patient received 2x calculated bolus ** 0715 CPC called Rx to let us know when to resume Acetylcysteine, recommended to resume infusion ~ 4 hr after infusion stopped. ** Per ED RN: pharmacy suggested resuming Acetylcysteine infusion - resumed 0600 or 0630am, before Peters had updated ** No sign of adverse effect, CPC noted anaphylaxis would be most common reaction seen. No reactions noted.  Baseline  INR 2.8 LFTs extremely elevated Tylenol level < 10, UDS positive for THC  Plan: Continuing Acetylcysteine infusion - CMET and INR ordered daily to 10/11   Minda Ditto PharmD 10/07/2018,12:04 PM

## 2018-10-07 NOTE — Progress Notes (Signed)
Of note also is that his acetaminophen level when drawn in the emergency room was not elevated.

## 2018-10-07 NOTE — Consult Note (Signed)
NAME:  Richard George, MRN:  [id number], DOB:  June 08, 1960, LOS: 0 ADMISSION DATE:  10/07/2018, CONSULTATION DATE:  10-6 REFERRING MD:  , CHIEF COMPLAINT: n/v  Brief History   58 year old man with hx of alcohol use, with acetaminophen toxicity, possible acute liver failure.   History of present illness   58 year old man with nausea vomiting, large recent doses acetaminophen for tooth pain  (50-70 tablets?).   None in past 24 hours.  ETOH use.  Nausea x  Fever yesterday.   Per patient, last etoh Thursday (told ED drinks about 10 beers per day).  Per ED doc, he has developed worsening AMS, and persistent N/V.   Past Medical History  Unk  Significant Hospital Events    Consults:  GI   Procedures:    Significant Diagnostic Tests:    Micro Data:    Antimicrobials:  augmentin x 1   Interim history/subjective:  IN ED, treated for ongoing nausea and vomiting with zofran and phenergan.    Objective   Blood pressure (!) 144/100, pulse 93, temperature 97.9 F (36.6 C), temperature source Oral, resp. rate (!) 26, height 5\' 11"  (1.803 m), weight 94.8 kg, SpO2 96 %.       No intake or output data in the 24 hours ending 10/07/18 0358 Filed Weights   10/06/18 2237  Weight: 94.8 kg    Examination: General: Alert, sitting up, oriented x 3.  Slurred speech, unable to give many details on his history.   HENT: NCAT Lungs: CTAB Cardiovascular: RRR no mgr Abdomen: Non tender non distended, liver palpable but non tender.  Extremities: Normal  Neuro: Alert, sitting up, oriented x 3.  Slurred speech, minimal tremor, no asterixis   Resolved Hospital Problem list     Assessment & Plan:  Transaminitis:  Elevated AST ALT, elevated T bili. Elevated INR.  Possibly Acute liver failure (elevated INR, encephalopathy). Per ER doc, one episode of hematemesis in hospital (started Protonix). Minimal tenderness, mild pain, +N/V.  Admitted to large acetaminophen intake over past several days  for tooth pain, likely acetaminophen toxicity. Started on NAC, given vitamin K, per poison control recommendations.   Possibly component of chronic liver diesease given underlying comorbidity. (Etoh).  Will Check 2238 abd to evaluate liver and GB further. Complete history when patient more awake regarding other possible ingestions.    GI consult pending.  Consider transfer to transplant center, but drinking history might preclude that.    Tooth pain: consider dental consult while in house.  Started on amox/clav.  Etoh use - monitor for signs of withdrawal.  Ativan ordered.   GIB - possible UGIB, monitor.     Best practice:  Diet: npo Pain/Anxiety/Delirium protocol (if indicated): DVT prophylaxis: SCD GI prophylaxis:Protonix Glucose control: iss Mobility: bedrest Code Status:Full Family Communication:   Labs   CBC: Recent Labs  Lab 10/07/18 0116  WBC 14.0*  HGB 17.2*  HCT 51.3  MCV 95.9  PLT 188    Basic Metabolic Panel: Recent Labs  Lab 10/07/18 0116  NA 137  K 4.6  CL 100  CO2 24  GLUCOSE 88  BUN 35*  CREATININE 2.21*  CALCIUM 10.7*   GFR: Estimated Creatinine Clearance: 43.9 mL/min (A) (by C-G formula based on SCr of 2.21 mg/dL (H)). Recent Labs  Lab 10/07/18 0116  WBC 14.0*    Liver Function Tests: Recent Labs  Lab 10/07/18 0116  AST >10,000*  ALT 10,188*  ALKPHOS 127*  BILITOT 4.9*  PROT  7.1  ALBUMIN 3.9   No results for input(s): LIPASE, AMYLASE in the last 168 hours. No results for input(s): AMMONIA in the last 168 hours.  ABG No results found for: PHART, PCO2ART, PO2ART, HCO3, TCO2, ACIDBASEDEF, O2SAT   Coagulation Profile: Recent Labs  Lab 10/07/18 0145  INR 2.8*    Cardiac Enzymes: No results for input(s): CKTOTAL, CKMB, CKMBINDEX, TROPONINI in the last 168 hours.  HbA1C: No results found for: HGBA1C  CBG: No results for input(s): GLUCAP in the last 168 hours.  Review of Systems:   Negative aside from HPI  Past Medical  History  He,  has no past medical history on file.   Surgical History   History reviewed. No pertinent surgical history.   Social History      Family History   His family history is not on file.   Allergies No Known Allergies   Home Medications  Prior to Admission medications   Medication Sig Start Date End Date Taking? Authorizing Provider  acetaminophen (TYLENOL) 500 MG tablet Take 1,000 mg by mouth every 6 (six) hours as needed for mild pain, moderate pain or headache.   Yes [provider]     Critical care time: 71

## 2018-10-07 NOTE — ED Notes (Signed)
Pt requested that staff contact his mother and let her know what is going on. Pt also requested that if something "bad" happens he would like staff to have his mother come to the hospital.

## 2018-10-07 NOTE — ED Notes (Signed)
Ultrasound at bedside

## 2018-10-07 NOTE — ED Notes (Signed)
2nd call @0413  Intensivist

## 2018-10-07 NOTE — Progress Notes (Signed)
[facility] Progress Note Patient Name: Graydon Fofana DOB: 07-29-1960 MRN: [id number]   Date of Service  10/07/2018  HPI/Events of Note  Toothache - Request for oral lidocaine swish and spit.  eICU Interventions  Will order oral lidocaine swish and spit X 1.     Intervention Category Major Interventions: Other:  Lysle Dingwall 10/07/2018, 10:37 PM

## 2018-10-07 NOTE — ED Notes (Signed)
Pt requested that I call his mother and ask her to come to the hospital. I called her, and she said she would be here soon.

## 2018-10-07 NOTE — ED Notes (Signed)
Report given to Bernard for 2W, Room 1236.

## 2018-10-08 LAB — COMPREHENSIVE METABOLIC PANEL
ALT: 6607 U/L — ABNORMAL HIGH (ref 0–44)
AST: 4552 U/L — ABNORMAL HIGH (ref 15–41)
Albumin: 2.8 g/dL — ABNORMAL LOW (ref 3.5–5.0)
Alkaline Phosphatase: 85 U/L (ref 38–126)
Anion gap: 12 (ref 5–15)
BUN: 26 mg/dL — ABNORMAL HIGH (ref 6–20)
CO2: 21 mmol/L — ABNORMAL LOW (ref 22–32)
Calcium: 8.8 mg/dL — ABNORMAL LOW (ref 8.9–10.3)
Chloride: 106 mmol/L (ref 98–111)
Creatinine, Ser: 1.41 mg/dL — ABNORMAL HIGH (ref 0.61–1.24)
GFR calc Af Amer: >60 mL/min (ref >60)
GFR calc non Af Amer: 55 mL/min — ABNORMAL LOW (ref >60)
Glucose, Bld: 142 mg/dL — ABNORMAL HIGH (ref 70–99)
Potassium: 4 mmol/L (ref 3.5–5.1)
Sodium: 139 mmol/L (ref 135–145)
Total Bilirubin: 3.3 mg/dL — ABNORMAL HIGH (ref 0.3–1.2)
Total Protein: 5.2 g/dL — ABNORMAL LOW (ref 6.5–8.1)

## 2018-10-08 LAB — PROTIME-INR
INR: 2.1 — ABNORMAL HIGH (ref 0.8–1.2)
Prothrombin Time: 23.5 seconds — ABNORMAL HIGH (ref 11.4–15.2)

## 2018-10-08 MED ORDER — PHYTONADIONE 5 MG PO TABS
5.0000 mg | ORAL_TABLET | Freq: Once | ORAL | Status: AC
  Administered 2018-10-08: 5 mg via ORAL
  Filled 2018-10-08: qty 1

## 2018-10-08 MED ORDER — PREDNISONE 20 MG PO TABS
40.0000 mg | ORAL_TABLET | Freq: Every day | ORAL | Status: DC
  Administered 2018-10-09 – 2018-10-10 (×2): 40 mg via ORAL
  Filled 2018-10-08 (×2): qty 2

## 2018-10-08 MED ORDER — LIDOCAINE VISCOUS HCL 2 % MT SOLN
15.0000 mL | OROMUCOSAL | Status: DC | PRN
  Filled 2018-10-08: qty 15

## 2018-10-08 NOTE — H&P (Signed)
Consult note by Dr. Duwayne Heck 10/07/18 is the H+P for this patient.

## 2018-10-08 NOTE — Progress Notes (Signed)
Patient had periods of sleep apnea last night and will need cpap qhs orders for tonight.

## 2018-10-08 NOTE — Progress Notes (Signed)
Pt arrived to room 1506 via wheelchair with nursing staff present. Pt assessed with no change from earlier documentation. Pt has been oriented to the room and unit policies, call bell is within reach, will continue to monitor.

## 2018-10-08 NOTE — Progress Notes (Addendum)
   NAME:  Richard George, MRN:  [id number], DOB:  [birth year], LOS: 1 ADMISSION DATE:  10/07/2018, CONSULTATION DATE:  10-6 REFERRING MD:  , CHIEF COMPLAINT: n/v  Brief History   58 year old man with hx of alcohol use, with acetaminophen toxicity, possible acute liver failure.   ->Per patient, last etoh Thursday (told ED drinks about 10 beers per day).  Per ED doc, he has developed worsening AMS, and persistent N/V.   Past Medical History  Denies  Significant Hospital Events   10/7 admitted w/ AMS, NV, acute liver injury, AKI and possible infected tooth. Started on N acetylcysteine protocol, IV fluids, and steroids.  PPI (for gastritis)  Consults:  GI   Procedures:    Significant Diagnostic Tests:  10/6 US Abdomen>>  Unremarkable with exception of small amount of pericholecystic fluid, normal liver appearance. 10/6 Hepatitis panel>> all neg 10/6 HIV>> neg  Micro Data:  10/6>> SARS-CoV-2 neg   Antimicrobials:  10/6>>augmentin day 2  Interim history/subjective:  Pt found sleeping comfortably.  Wakes and communicates appropriately, alert and oriented.  Denies complaints.  RN reports periods of sleep apnea overnight.    Objective   Blood pressure (Abnormal) 115/59, pulse 80, temperature 97.8 F (36.6 C), temperature source Oral, resp. rate 16, height 5\' 11"  (1.803 m), weight 89.4 kg, SpO2 94 %.        Intake/Output Summary (Last 24 hours) at 10/08/2018 1039 Last data filed at 10/08/2018 0730 Gross per 24 hour  Intake 4144.07 ml  Output 1525 ml  Net 2619.07 ml   Filed Weights   10/06/18 2237 10/07/18 1759 10/08/18 0500  Weight: 94.8 kg 89 kg 89.4 kg    Examination: General: Alert, sitting up, oriented x 3.    HEENT: Left lower tooth broken, painful.  No swelling or abscess noted. Lungs: Clear bilaterally. Cardiovascular: RRR no mgr Abdomen: Non tender non distended, liver palpable but non tender.  Extremities: Normal  Neuro: Alert, sitting up, oriented x 3.  No tremor.    Resolved Hospital Problem list     Assessment & Plan:  Transaminitis/acuteliver injury 2/2 tylenol overdose and ETOH -LFTs improving Plan -Continue acetylcysteine x 24 hours per poison control recs as LFTs and INR remain elevated, although improved. -cont steroids.  -f/u CMP, coags in AM  Coagulopathy 2/2 liver injury  Plan Trend INR  Vit K X 1  Tooth pain:  Plan Augmentin day 2.  Lidocaine swish/spit prn. Complete 10d Needs to f/u w/ his dentist as outpt  AKI anion gap metabolic acidosis resolved.  -improving.  Plan Cont IVFs Renal dose meds Am chemistry    Etoh use - Monitor for signs of withdrawal.  Plan  PRN ativan ordered.   GIB/gastritis- No further N/V.   Plan Continue PPI.  GI service on board, appreciate assistance.  Best practice:  Diet: npo Pain/Anxiety/Delirium protocol (if indicated): DVT prophylaxis: SCD GI prophylaxis:Protonix Glucose control: iss Mobility: bedrest Code Status:Full Family Communication:  Pending   Transfer out of ICU   Erick Colace ACNP-BC [place] Pager # [phone] OR # [phone] if no answer

## 2018-10-08 NOTE — Progress Notes (Signed)
Updated poison control on patients condition. Based on assessment and labs, poison control is recommending to stay on acetylcysteine protocol for another 24 hours due to INR>2. They also recommend another BMET and INR in 12 hours.

## 2018-10-08 NOTE — Progress Notes (Signed)
Report given to [place], Therapist, sports and all questions answered. Pt transferred via wheelchair to 1506 with all belongings. Ashley(daughter) notified of transfer.

## 2018-10-08 NOTE — Progress Notes (Signed)
[facility] Progress Note  Subjective: The patient feels well today and has no complaints.  He is alert and oriented.  Objective: Vital signs in last 24 hours: Temp:  [96.9 F (36.1 C)-98.8 F (37.1 C)] 97.8 F (36.6 C) (10/07 0800) Pulse Rate:  [71-94] 78 (10/07 0800) Resp:  [15-27] 17 (10/07 0800) BP: (133-181)/(51-120) 150/88 (10/07 0800) SpO2:  [93 %-99 %] 94 % (10/07 0800) Weight:  [89 kg-89.4 kg] 89.4 kg (10/07 0500) Weight change: -5.802 kg   PE:  No distress.  Communicating appropriately.  Heart regular rhythm  Abdomen soft and nontender  Prothrombin time is still elevated as is the INR.  Total bilirubin 3.3.  AST 4552.  ALT 6607.  Lab Results: Results for orders placed or performed during the hospital encounter of 10/07/18 (from the past 24 hour(s))  Hepatitis panel, acute     Status: None   Collection Time: 10/07/18 10:11 AM  Result Value Ref Range   Hepatitis B Surface Ag NON REACTIVE NON REACTIVE   HCV Ab NON REACTIVE NON REACTIVE   Hep A IgM NON REACTIVE NON REACTIVE   Hep B C IgM NON REACTIVE NON REACTIVE  Comprehensive metabolic panel     Status: Abnormal   Collection Time: 10/07/18 12:33 PM  Result Value Ref Range   Sodium 137 135 - 145 mmol/L   Potassium 4.0 3.5 - 5.1 mmol/L   Chloride 102 98 - 111 mmol/L   CO2 23 22 - 32 mmol/L   Glucose, Bld 92 70 - 99 mg/dL   BUN 37 (H) 6 - 20 mg/dL   Creatinine, Ser 0.35 (H) 0.61 - 1.24 mg/dL   Calcium 9.0 8.9 - 00.9 mg/dL   Total Protein 5.9 (L) 6.5 - 8.1 g/dL   Albumin 3.2 (L) 3.5 - 5.0 g/dL   AST 3,818 (H) 15 - 41 U/L   ALT 8,927 (H) 0 - 44 U/L   Alkaline Phosphatase 93 38 - 126 U/L   Total Bilirubin 3.8 (H) 0.3 - 1.2 mg/dL   GFR calc non Af Amer 36 (L) >60 mL/min   GFR calc Af Amer 42 (L) >60 mL/min   Anion gap 12 5 - 15  Lactic acid, plasma     Status: Abnormal   Collection Time: 10/07/18 12:33 PM  Result Value Ref Range   Lactic Acid, Venous 2.2 (HH) 0.5 - 1.9 mmol/L  Lipase, blood      Status: Abnormal   Collection Time: 10/07/18 12:33 PM  Result Value Ref Range   Lipase 652 (H) 11 - 51 U/L  MRSA PCR Screening     Status: None   Collection Time: 10/07/18  4:55 PM   Specimen: Nasal Mucosa; Nasopharyngeal  Result Value Ref Range   MRSA by PCR NEGATIVE NEGATIVE  Ammonia     Status: Abnormal   Collection Time: 10/07/18  4:57 PM  Result Value Ref Range   Ammonia 59 (H) 9 - 35 umol/L  Protime-INR     Status: Abnormal   Collection Time: 10/07/18  4:57 PM  Result Value Ref Range   Prothrombin Time 27.4 (H) 11.4 - 15.2 seconds   INR 2.6 (H) 0.8 - 1.2  Comprehensive metabolic panel     Status: Abnormal   Collection Time: 10/08/18  1:54 AM  Result Value Ref Range   Sodium 139 135 - 145 mmol/L   Potassium 4.0 3.5 - 5.1 mmol/L   Chloride 106 98 - 111 mmol/L   CO2 21 (L) 22 - 32  mmol/L   Glucose, Bld 142 (H) 70 - 99 mg/dL   BUN 26 (H) 6 - 20 mg/dL   Creatinine, Ser 1.41 (H) 0.61 - 1.24 mg/dL   Calcium 8.8 (L) 8.9 - 10.3 mg/dL   Total Protein 5.2 (L) 6.5 - 8.1 g/dL   Albumin 2.8 (L) 3.5 - 5.0 g/dL   AST 4,552 (H) 15 - 41 U/L   ALT 6,607 (H) 0 - 44 U/L   Alkaline Phosphatase 85 38 - 126 U/L   Total Bilirubin 3.3 (H) 0.3 - 1.2 mg/dL   GFR calc non Af Amer 55 (L) >60 mL/min   GFR calc Af Amer >60 >60 mL/min   Anion gap 12 5 - 15  Protime-INR     Status: Abnormal   Collection Time: 10/08/18  1:54 AM  Result Value Ref Range   Prothrombin Time 23.5 (H) 11.4 - 15.2 seconds   INR 2.1 (H) 0.8 - 1.2    Studies/Results: No results found.    Assessment: Acute liver injury from combination of alcohol and acetaminophen.  Plan:   Continue acetylcysteine protocol.  Steroids have been added intravenously.  Continue to monitor patient's clinical status and LFTs.    SAM F Jonaven Hilgers 10/08/2018, 9:10 AM  Pager: [phone] If no answer or after 5 PM call [phone]

## 2018-10-09 DIAGNOSIS — K047 Periapical abscess without sinus: Secondary | ICD-10-CM

## 2018-10-09 DIAGNOSIS — F1210 Cannabis abuse, uncomplicated: Secondary | ICD-10-CM

## 2018-10-09 DIAGNOSIS — K7200 Acute and subacute hepatic failure without coma: Secondary | ICD-10-CM

## 2018-10-09 DIAGNOSIS — F10129 Alcohol abuse with intoxication, unspecified: Secondary | ICD-10-CM

## 2018-10-09 DIAGNOSIS — T391X1A Poisoning by 4-Aminophenol derivatives, accidental (unintentional), initial encounter: Principal | ICD-10-CM

## 2018-10-09 DIAGNOSIS — R1013 Epigastric pain: Secondary | ICD-10-CM

## 2018-10-09 DIAGNOSIS — F1910 Other psychoactive substance abuse, uncomplicated: Secondary | ICD-10-CM

## 2018-10-09 DIAGNOSIS — N179 Acute kidney failure, unspecified: Secondary | ICD-10-CM

## 2018-10-09 LAB — CBC
HCT: 36.6 % — ABNORMAL LOW (ref 39.0–52.0)
Hemoglobin: 12.6 g/dL — ABNORMAL LOW (ref 13.0–17.0)
MCH: 33.2 pg (ref 26.0–34.0)
MCHC: 34.4 g/dL (ref 30.0–36.0)
MCV: 96.3 fL (ref 80.0–100.0)
Platelets: 159 10*3/uL (ref 150–400)
RBC: 3.8 MIL/uL — ABNORMAL LOW (ref 4.22–5.81)
RDW: 12.6 % (ref 11.5–15.5)
WBC: 13.3 10*3/uL — ABNORMAL HIGH (ref 4.0–10.5)
nRBC: 0 % (ref 0.0–0.2)

## 2018-10-09 LAB — COMPREHENSIVE METABOLIC PANEL
ALT: 3563 U/L — ABNORMAL HIGH (ref 0–44)
AST: 840 U/L — ABNORMAL HIGH (ref 15–41)
Albumin: 2.4 g/dL — ABNORMAL LOW (ref 3.5–5.0)
Alkaline Phosphatase: 81 U/L (ref 38–126)
Anion gap: 9 (ref 5–15)
BUN: 22 mg/dL — ABNORMAL HIGH (ref 6–20)
CO2: 24 mmol/L (ref 22–32)
Calcium: 8.4 mg/dL — ABNORMAL LOW (ref 8.9–10.3)
Chloride: 106 mmol/L (ref 98–111)
Creatinine, Ser: 0.93 mg/dL (ref 0.61–1.24)
GFR calc Af Amer: >60 mL/min (ref >60)
GFR calc non Af Amer: >60 mL/min (ref >60)
Glucose, Bld: 141 mg/dL — ABNORMAL HIGH (ref 70–99)
Potassium: 3.6 mmol/L (ref 3.5–5.1)
Sodium: 139 mmol/L (ref 135–145)
Total Bilirubin: 3.5 mg/dL — ABNORMAL HIGH (ref 0.3–1.2)
Total Protein: 4.4 g/dL — ABNORMAL LOW (ref 6.5–8.1)

## 2018-10-09 LAB — PROTIME-INR
INR: 1.6 — ABNORMAL HIGH (ref 0.8–1.2)
Prothrombin Time: 18.7 seconds — ABNORMAL HIGH (ref 11.4–15.2)

## 2018-10-09 MED ORDER — BENZONATATE 100 MG PO CAPS
200.0000 mg | ORAL_CAPSULE | Freq: Three times a day (TID) | ORAL | Status: DC | PRN
  Administered 2018-10-09: 01:00:00 200 mg via ORAL
  Filled 2018-10-09: qty 2

## 2018-10-09 MED ORDER — PANTOPRAZOLE SODIUM 40 MG PO TBEC
40.0000 mg | DELAYED_RELEASE_TABLET | Freq: Two times a day (BID) | ORAL | Status: DC
  Administered 2018-10-09 – 2018-10-10 (×2): 40 mg via ORAL
  Filled 2018-10-09 (×2): qty 1

## 2018-10-09 NOTE — Progress Notes (Signed)
MEDICATION RELATED CONSULT NOTE   Pharmacy Consult for Acetylcysteine Indication: Recent Tylenol use, chronic ETOH abuse  No Known Allergies  Patient Measurements: Height: 5\' 11"  (180.3 cm) Weight: 208 lb 8.9 oz (94.6 kg) IBW/kg (Calculated) : 75.3  Vital Signs: Temp: 97.5 F (36.4 C) (10/08 0523) Temp Source: Oral (10/08 0523) BP: 153/95 (10/08 0523) Pulse Rate: 63 (10/08 0523) Intake/Output from previous day: 10/07 0701 - 10/08 0700 In: 2789 [I.V.:2789] Out: 1250 [Urine:1250] Intake/Output from this shift: No intake/output data recorded.  Labs: Recent Labs    10/07/18 0116 10/07/18 0642 10/07/18 1233 10/08/18 0154 10/09/18 0530  WBC 14.0* 11.4*  --   --  13.3*  HGB 17.2* 14.8  --   --  12.6*  HCT 51.3 44.4  --   --  36.6*  PLT 188 164  --   --  159  CREATININE 2.21* 2.10* 1.99* 1.41* 0.93  MG  --  1.8  --   --   --   PHOS  --  2.6  --   --   --   ALBUMIN 3.9 2.9* 3.2* 2.8* 2.4*  PROT 7.1 5.7* 5.9* 5.2* 4.4*  AST >10,000* 9,128* 8,982* 4,552* 840*  ALT 10,188* 8,773* 8,927* 6,607* 3,563*  ALKPHOS 127* 93 93 85 81  BILITOT 4.9* 3.8* 3.8* 3.3* 3.5*   Estimated Creatinine Clearance: 104.1 mL/min (by C-G formula based on SCr of 0.93 mg/dL).  Medical History: History reviewed. No pertinent past medical history.  Medications:  Scheduled:  . amLODipine  5 mg Oral Daily  . amoxicillin-clavulanate  1 tablet Oral Q12H  . Chlorhexidine Gluconate Cloth  6 each Topical Daily  . mouth rinse  15 mL Mouth Rinse BID  . pantoprazole (PROTONIX) IV  40 mg Intravenous Q12H  . predniSONE  40 mg Oral Q breakfast   Infusions:  . sodium chloride 20 mL/hr at 10/08/18 1500  . acetylcysteine 15 mg/kg/hr (10/08/18 1500)  . lactated ringers 125 mL/hr at 10/09/18 0112   Assessment: Started Acetylcysteine 10/7 am in ED CPC updated with labs---no new orders, est. > 24hr LOT  ** entire 1st bag infused at bolus rate, started 0300. Patient received 2x calculated bolus ** 0715 CPC  called Rx to let us know when to resume Acetylcysteine, recommended to resume infusion ~ 4 hr after infusion stopped. ** Per ED RN: pharmacy suggested resuming Acetylcysteine infusion - resumed 0600 or 0630am, before [place] had updated ** No sign of adverse effect, CPC noted anaphylaxis would be most common reaction seen. No reactions noted.  Baseline INR 2.8 LFTs extremely elevated Tylenol level < 10, UDS positive for THC  - CMET and INR ordered daily to 10/11  Plan:  10/8 per [facility], can discontinue Acetylcysteine infusion - INR < 2 (1.6 this am)  Minda Ditto PharmD 10/09/2018,7:31 AM

## 2018-10-09 NOTE — Progress Notes (Signed)
The patient is receiving Protonix by the intravenous route.  Based on criteria approved by the Pharmacy and [place], the medication is being converted to the equivalent oral dose form.  These criteria include: -No active GI bleeding -Able to tolerate diet of full liquids (or better) or tube feeding -Able to tolerate other medications by the oral or enteral route  If you have any questions about this conversion, please contact the Pharmacy Department (phone [phone]).  Thank you.   Minda Ditto PharmD Pager [phone] 10/09/2018, 3:00 PM

## 2018-10-09 NOTE — TOC Initial Note (Signed)
Transition of Care [facility]) - Initial/Assessment Note    Patient Details  Name: Richard George MRN: [id number] Date of Birth: 1960/01/23  [facility]) CM/SW Contact:    Nila Nephew, LCSW Phone Number: [phone] 10/09/2018, 3:04 PM  Clinical Narrative:        Pt admitted with Transaminitis after taking several tylenol for days in a row due to tooth pain.   Consulted for assistance with establishing PCP as pt is uninsured. Pt states he used to see [facility] but has not in some years. States he would rather see [facility] if appropriate/available due to his current address being closer. CSW will look into appointment availability. Pt aware he can only see one of the clinics.   Also requests help with seeing a dentist. CSW investigating options in area for uninsured pts.   Has alcohol use history, reported no use in several days- BAL 10 upon admission. No w/d sx evident or reported during interaction.  Pt living with his parents and reports no needs at home. Pleasant, gracious, and appropriate/engaged during assessment.  Will follow up with PCP/Dental information found by including in AVS- pt agreeable.              Expected Discharge Plan: Home/Self Care Barriers to Discharge: Continued Medical Work up   Patient Goals and CMS Choice Patient states their goals for this hospitalization and ongoing recovery are:: "I need to go back to the doctor and see if I can get an orange card again"      Expected Discharge Plan and Services Expected Discharge Plan: Home/Self Care   Discharge Planning Services: CM Consult   Living arrangements for the past 2 months: Single Family Home                                      Prior Living Arrangements/Services Living arrangements for the past 2 months: Single Family Home Lives with:: Parents Patient language and need for interpreter reviewed:: Yes Do you feel safe going back to the place where you live?: Yes      Need  for Family Participation in Patient Care: No (Comment) Care giver support system in place?: Yes (comment)   Criminal Activity/Legal Involvement Pertinent to Current Situation/Hospitalization: No - Comment as needed  Activities of Daily Living Home Assistive Devices/Equipment: Eyeglasses ADL Screening (condition at time of admission) Patient's cognitive ability adequate to safely complete daily activities?: Yes Is the patient deaf or have difficulty hearing?: No Does the patient have difficulty seeing, even when wearing glasses/contacts?: No Does the patient have difficulty concentrating, remembering, or making decisions?: No Patient able to express need for assistance with ADLs?: Yes Does the patient have difficulty dressing or bathing?: No Independently performs ADLs?: Yes (appropriate for developmental age) Does the patient have difficulty walking or climbing stairs?: No Weakness of Legs: None Weakness of Arms/Hands: None  Permission Sought/Granted                  Emotional Assessment Appearance:: Appears stated age, Developmentally appropriate Attitude/Demeanor/Rapport: Engaged Affect (typically observed): Pleasant Orientation: : Oriented to Self, Oriented to Place, Oriented to  Time, Oriented to Situation Alcohol / Substance Use: Alcohol Use Psych Involvement: No (comment)  Admission diagnosis:  Transaminitis [R74.01] Accidental acetaminophen overdose, initial encounter [T39.1X1A] Acute liver failure without hepatic coma [K72.00] Patient Active Problem List   Diagnosis Date Noted  . Transaminitis 10/07/2018  PCP:  Patient, No Pcp Per Pharmacy:  No Pharmacies Listed    Social Determinants of Health (SDOH) Interventions    Readmission Risk Interventions No flowsheet data found.

## 2018-10-09 NOTE — Progress Notes (Signed)
[facility] Progress Note  Subjective: The patient feels fine and has no complaints.  States he ate a good breakfast.  No abdominal pain no nausea or vomiting.  Liver enzymes are gradually improving as noted on his labs.  Objective: Vital signs in last 24 hours: Temp:  [97.4 F (36.3 C)-98 F (36.7 C)] 97.5 F (36.4 C) (10/08 0523) Pulse Rate:  [63-80] 63 (10/08 0523) Resp:  [16-19] 19 (10/08 0523) BP: (115-153)/(59-95) 153/95 (10/08 0523) SpO2:  [94 %-97 %] 97 % (10/08 0523) Weight:  [94.6 kg] 94.6 kg (10/08 0523) Weight change: 5.6 kg   PE:  No acute distress  Heart regular rhythm  Abdomen soft nontender no hepatomegaly noted  Lab Results: Results for orders placed or performed during the hospital encounter of 10/07/18 (from the past 24 hour(s))  Comprehensive metabolic panel     Status: Abnormal   Collection Time: 10/09/18  5:30 AM  Result Value Ref Range   Sodium 139 135 - 145 mmol/L   Potassium 3.6 3.5 - 5.1 mmol/L   Chloride 106 98 - 111 mmol/L   CO2 24 22 - 32 mmol/L   Glucose, Bld 141 (H) 70 - 99 mg/dL   BUN 22 (H) 6 - 20 mg/dL   Creatinine, Ser 0.93 0.61 - 1.24 mg/dL   Calcium 8.4 (L) 8.9 - 10.3 mg/dL   Total Protein 4.4 (L) 6.5 - 8.1 g/dL   Albumin 2.4 (L) 3.5 - 5.0 g/dL   AST 840 (H) 15 - 41 U/L   ALT 3,563 (H) 0 - 44 U/L   Alkaline Phosphatase 81 38 - 126 U/L   Total Bilirubin 3.5 (H) 0.3 - 1.2 mg/dL   GFR calc non Af Amer >60 >60 mL/min   GFR calc Af Amer >60 >60 mL/min   Anion gap 9 5 - 15  Protime-INR     Status: Abnormal   Collection Time: 10/09/18  5:30 AM  Result Value Ref Range   Prothrombin Time 18.7 (H) 11.4 - 15.2 seconds   INR 1.6 (H) 0.8 - 1.2  CBC     Status: Abnormal   Collection Time: 10/09/18  5:30 AM  Result Value Ref Range   WBC 13.3 (H) 4.0 - 10.5 K/uL   RBC 3.80 (L) 4.22 - 5.81 MIL/uL   Hemoglobin 12.6 (L) 13.0 - 17.0 g/dL   HCT 36.6 (L) 39.0 - 52.0 %   MCV 96.3 80.0 - 100.0 fL   MCH 33.2 26.0 - 34.0 pg   MCHC  34.4 30.0 - 36.0 g/dL   RDW 12.6 11.5 - 15.5 %   Platelets 159 150 - 400 K/uL   nRBC 0.0 0.0 - 0.2 %    Studies/Results: No results found.    Assessment: Acute liver injury secondary to a combination of alcohol and Tylenol  Plan:   Continue current therapy.  Clinically the patient seems to be improving and his labs also seem to be gradually improving.  INR has improved as has the prothrombin time.    Cassell Clement 10/09/2018, 9:24 AM  Pager: [phone] If no answer or after 5 PM call [phone]

## 2018-10-09 NOTE — Progress Notes (Signed)
PROGRESS NOTE    Richard George  YQM:[id number]  DOB: 07-16-1960  DOA: 10/07/2018 PCP: Patient, No Pcp Per  Brief Narrative:   58 year old male with history of alcohol abuse/dependence (drinks 9 pack beer per day) recently developed a toothache and was taking 6 to 8 pills of Tylenol several times a day for about 4 to 5 days prior to presentation.  He presented to the ED on 10/6 with complaints of vomiting. Labs obtained here in the emergency room show WBCs 11.4, H&H 14.8 and 44.4 respectively.  Total bilirubin 3.8, alkaline phosphatase 93, AST 9128, ALT 8773, ammonia 83, prothrombin time 29.1, INR 2.8, EtOH level greater than 10.patient admitted to ICU with diagnosis of acute liver injury in the setting of excessive Tylenol ingestion/alcohol abuse and N acetylcysteine protocol was instituted along with steroids.  Patient's transaminases peaked to greater than 10,000 subsequent to which they started to downtrend.  He is transferred out of ICU to hospitalist service today, 10/8  Subjective:  Patient resting comfortably, denies any acute complaints.  Asking if he could go home soon.  Objective: Vitals:   10/08/18 1543 10/08/18 2044 10/09/18 0523 10/09/18 1442  BP: 124/83 138/78 (!) 153/95 138/88  Pulse: 67 80 63 73  Resp: Temp: 98 F (36.7 C) 97.8 F (36.6 C) (!) 97.5 F (36.4 C) 97.7 F (36.5 C)  TempSrc:  Oral Oral Oral  SpO2: 95% 97% 97% 96%  Weight:   94.6 kg   Height:        Intake/Output Summary (Last 24 hours) at 10/09/2018 1653 Last data filed at 10/09/2018 1500 Gross per 24 hour  Intake 3797.63 ml  Output 1325 ml  Net 2472.63 ml   Filed Weights   10/07/18 1759 10/08/18 0500 10/09/18 0523  Weight: 89 kg 89.4 kg 94.6 kg    Physical Examination:  General exam: Appears calm and comfortable  Respiratory system: Clear to auscultation. Respiratory effort normal. Cardiovascular system: S1 & S2 heard, RRR. No JVD, murmurs, rubs, gallops or clicks. No pedal  edema. Gastrointestinal system: Abdomen is nondistended, soft and nontender. No organomegaly or masses felt. Normal bowel sounds heard. Central nervous system: Alert and oriented. No focal neurological deficits. Extremities: Symmetric 5 x 5 power. Skin: No rashes, lesions or ulcers Psychiatry: Judgement and insight appear normal. Mood & affect appropriate.     Data Reviewed: I have personally reviewed following labs and imaging studies  CBC: Recent Labs  Lab 10/07/18 0116 10/07/18 0642 10/09/18 0530  WBC 14.0* 11.4* 13.3*  HGB 17.2* 14.8 12.6*  HCT 51.3 44.4 36.6*  MCV 95.9 96.5 96.3  PLT 188 164 159   Basic Metabolic Panel: Recent Labs  Lab 10/07/18 0116 10/07/18 0642 10/07/18 1233 10/08/18 0154 10/09/18 0530  NA 137 138 137 139 139  K 4.6 4.3 4.0 4.0 3.6  CL 100 103 102 106 106  CO2 24 16* 23 21* 24  GLUCOSE 88 119* 92 142* 141*  BUN 35* 35* 37* 26* 22*  CREATININE 2.21* 2.10* 1.99* 1.41* 0.93  CALCIUM 10.7* 8.8* 9.0 8.8* 8.4*  MG  --  1.8  --   --   --   PHOS  --  2.6  --   --   --    GFR: Estimated Creatinine Clearance: 104.1 mL/min (by C-G formula based on SCr of 0.93 mg/dL). Liver Function Tests: Recent Labs  Lab 10/07/18 0116 10/07/18 0642 10/07/18 1233 10/08/18 0154 10/09/18 0530  AST >10,000* 9,128* 8,982* 4,552* 840*  ALT 10,188* 8,773* 8,927* 6,607* 3,563*  ALKPHOS 127* 93 93 85 81  BILITOT 4.9* 3.8* 3.8* 3.3* 3.5*  PROT 7.1 5.7* 5.9* 5.2* 4.4*  ALBUMIN 3.9 2.9* 3.2* 2.8* 2.4*   Recent Labs  Lab 10/07/18 1233  LIPASE 652*   Recent Labs  Lab 10/07/18 0442 10/07/18 1657  AMMONIA 83* 59*   Coagulation Profile: Recent Labs  Lab 10/07/18 0145 10/07/18 1657 10/08/18 0154 10/09/18 0530  INR 2.8* 2.6* 2.1* 1.6*   Cardiac Enzymes: No results for input(s): CKTOTAL, CKMB, CKMBINDEX, TROPONINI in the last 168 hours. BNP (last 3 results) No results for input(s): PROBNP in the last 8760 hours. HbA1C: No results for input(s): HGBA1C in  the last 72 hours. CBG: No results for input(s): GLUCAP in the last 168 hours. Lipid Profile: No results for input(s): CHOL, HDL, LDLCALC, TRIG, CHOLHDL, LDLDIRECT in the last 72 hours. Thyroid Function Tests: No results for input(s): TSH, T4TOTAL, FREET4, T3FREE, THYROIDAB in the last 72 hours. Anemia Panel: No results for input(s): VITAMINB12, FOLATE, FERRITIN, TIBC, IRON, RETICCTPCT in the last 72 hours. Sepsis Labs: Recent Labs  Lab 10/07/18 1233  LATICACIDVEN 2.2*    Recent Results (from the past 240 hour(s))  Urine Culture     Status: Abnormal   Collection Time: 10/07/18  1:30 AM   Specimen: Urine, Random  Result Value Ref Range Status   Specimen Description   Final    URINE, RANDOM Performed at [facility], [address], [place], [place] [address]    Special Requests   Final    NONE Performed at [facility], [address] [place], [place] [address]    Culture (A)  Final    <10,000 COLONIES/mL INSIGNIFICANT GROWTH Performed at [facility], [address] [place], [place] [address]    Report Status 10/07/2018 FINAL  Final  SARS Coronavirus 2 [facility] order, Performed in [facility] hospital lab) Nasopharyngeal Urine, Clean Catch     Status: None   Collection Time: 10/07/18  3:10 AM   Specimen: Urine, Clean Catch; Nasopharyngeal  Result Value Ref Range Status   SARS Coronavirus 2 NEGATIVE NEGATIVE Final    Comment: (NOTE) If result is NEGATIVE SARS-CoV-2 target nucleic acids are NOT DETECTED. The SARS-CoV-2 RNA is generally detectable in upper and lower  respiratory specimens during the acute phase of infection. The lowest  concentration of SARS-CoV-2 viral copies this assay can detect is 250  copies / mL. A negative result does not preclude SARS-CoV-2 infection  and should not be used as the sole basis for treatment or other  patient management decisions.  A negative result may occur with  improper specimen  collection / handling, submission of specimen other  than nasopharyngeal swab, presence of viral mutation(s) within the  areas targeted by this assay, and inadequate number of viral copies  (<250 copies / mL). A negative result must be combined with clinical  observations, patient history, and epidemiological information. If result is POSITIVE SARS-CoV-2 target nucleic acids are DETECTED. The SARS-CoV-2 RNA is generally detectable in upper and lower  respiratory specimens dur ing the acute phase of infection.  Positive  results are indicative of active infection with SARS-CoV-2.  Clinical  correlation with patient history and other diagnostic information is  necessary to determine patient infection status.  Positive results do  not rule out bacterial infection or co-infection with other viruses. If result is PRESUMPTIVE POSTIVE SARS-CoV-2 nucleic acids MAY BE PRESENT.   A  presumptive positive result was obtained on the submitted specimen  and confirmed on repeat testing.  While 2019 novel coronavirus  (SARS-CoV-2) nucleic acids may be present in the submitted sample  additional confirmatory testing may be necessary for epidemiological  and / or clinical management purposes  to differentiate between  SARS-CoV-2 and other Sarbecovirus currently known to infect humans.  If clinically indicated additional testing with an alternate test  methodology [phone]([id number]) is advised. The SARS-CoV-2 RNA is generally  detectable in upper and lower respiratory sp ecimens during the acute  phase of infection. The expected result is Negative. Fact Sheet for Patients:  BoilerBrush.com.cyhttps://www.fda.gov/media/136312/download Fact Sheet for Healthcare Providers: https://pope.com/https://www.fda.gov/media/136313/download This test is not yet approved or cleared by the [place] FDA and has been authorized for detection and/or diagnosis of SARS-CoV-2 by FDA under an Emergency Use Authorization (EUA).  This EUA will remain in effect  (meaning this test can be used) for the duration of the COVID-19 declaration under Section 564(b)(1) of the Act, 21 U.S.C. section 360bbb-3(b)(1), unless the authorization is terminated or revoked sooner. Performed at [facility], [address] [place], [place] [address]   MRSA PCR Screening     Status: None   Collection Time: 10/07/18  4:55 PM   Specimen: Nasal Mucosa; Nasopharyngeal  Result Value Ref Range Status   MRSA by PCR NEGATIVE NEGATIVE Final    Comment:        The GeneXpert MRSA Assay (FDA approved for NASAL specimens only), is one component of a comprehensive MRSA colonization surveillance program. It is not intended to diagnose MRSA infection nor to guide or monitor treatment for MRSA infections. Performed at [facility], [address] [place], [place] [address]       Radiology Studies: No results found.  Scheduled Meds: . amLODipine  5 mg Oral Daily  . amoxicillin-clavulanate  1 tablet Oral Q12H  . Chlorhexidine Gluconate Cloth  6 each Topical Daily  . mouth rinse  15 mL Mouth Rinse BID  . pantoprazole  40 mg Oral BID  . predniSONE  40 mg Oral Q breakfast   Continuous Infusions: . sodium chloride 20 mL/hr at 10/08/18 1500  . lactated ringers 125 mL/hr at 10/09/18 0600    Assessment & Plan:    1.  Acute liver injury in the setting of alcohol abuse/Tylenol overuse: Alcohol level at 10 and acetaminophen level less than 10 on presentation.  Transaminases improving with AST now down to less than thousand and ALT down to 3000.  Hepatitis profile/HIV profile negative.  INR improved from 2.8-1.6.  Ammonia level elevated at 59 on 10/6 but mentating well.  Off N-acetylcysteine.  Transfer out of ICU on prednisone-recommended for 28 days.  Continue IV hydration for another day  2.  AKI: Likely due to GI losses/vomiting/dehydration.  Improved with IV hydration.  Creatinine on admission at 2.1 and now down to 0.9.  Avoid  nephrotoxic agents.  3.  Alcohol abuse/dependence: No signs of withdrawal while here.  Not on CIWA protocol.  Multivitamin/thiamine.  Counseled regarding risks of progression to fulminant liver failure/liver cirrhosis with continued abuse.  4.  Marijuana abuse: Urine drug screen positive for marijuana and patient admits to chronic use.  Counseled to quit.  5.  Jaw pain/tooth infection: Started on Augmentin (day 3) while here and intent to complete 10-day course.  Will need dental outpatient follow-up with [facility]  6.  Dyspepsia/gastritis: PPI  7.?  Hypertension: Blood pressure stable on Norvasc  DVT prophylaxis: SCD Code Status: Full code Family / Patient Communication: Discussed with patient Disposition Plan: Home in next 24 to 48 hours     LOS: 2 days    Time spent:     Guilford Shi, MD [facility] Pager [phone]  If 7PM-7AM, please contact night-coverage [email or web address] Password [email or web address] 10/09/2018, 4:53 PM

## 2018-10-10 DIAGNOSIS — F1910 Other psychoactive substance abuse, uncomplicated: Secondary | ICD-10-CM

## 2018-10-10 DIAGNOSIS — I10 Essential (primary) hypertension: Secondary | ICD-10-CM

## 2018-10-10 LAB — COMPREHENSIVE METABOLIC PANEL
ALT: 2338 U/L — ABNORMAL HIGH (ref 0–44)
AST: 276 U/L — ABNORMAL HIGH (ref 15–41)
Albumin: 2.5 g/dL — ABNORMAL LOW (ref 3.5–5.0)
Alkaline Phosphatase: 82 U/L (ref 38–126)
Anion gap: 9 (ref 5–15)
BUN: 18 mg/dL (ref 6–20)
CO2: 25 mmol/L (ref 22–32)
Calcium: 8.6 mg/dL — ABNORMAL LOW (ref 8.9–10.3)
Chloride: 105 mmol/L (ref 98–111)
Creatinine, Ser: 0.93 mg/dL (ref 0.61–1.24)
GFR calc Af Amer: >60 mL/min (ref >60)
GFR calc non Af Amer: >60 mL/min (ref >60)
Glucose, Bld: 125 mg/dL — ABNORMAL HIGH (ref 70–99)
Potassium: 3.7 mmol/L (ref 3.5–5.1)
Sodium: 139 mmol/L (ref 135–145)
Total Bilirubin: 2.8 mg/dL — ABNORMAL HIGH (ref 0.3–1.2)
Total Protein: 4.5 g/dL — ABNORMAL LOW (ref 6.5–8.1)

## 2018-10-10 LAB — PROTIME-INR
INR: 1.3 — ABNORMAL HIGH (ref 0.8–1.2)
Prothrombin Time: 16.3 seconds — ABNORMAL HIGH (ref 11.4–15.2)

## 2018-10-10 MED ORDER — LIDOCAINE VISCOUS HCL 2 % MT SOLN: 15.0000 mL | OROMUCOSAL | 0 refills | Status: DC | PRN

## 2018-10-10 MED ORDER — PREDNISONE 10 MG (21) PO TBPK: ORAL_TABLET | ORAL | 0 refills | Status: DC

## 2018-10-10 MED ORDER — AMLODIPINE BESYLATE 5 MG PO TABS: 5.0000 mg | ORAL_TABLET | Freq: Every day | ORAL | 1 refills | Status: DC

## 2018-10-10 MED ORDER — AMOXICILLIN-POT CLAVULANATE 875-125 MG PO TABS: 1.0000 | ORAL_TABLET | Freq: Two times a day (BID) | ORAL | 0 refills | Status: DC

## 2018-10-10 MED FILL — predniSONE 10 MG TABS: 10 | 6 days supply | Qty: 21 | Fill #0

## 2018-10-10 MED FILL — AMLODIPINE BESYLATE 5 MG TA: 5 | 30 days supply | Qty: 30 | Fill #0

## 2018-10-10 MED FILL — AMOX-CLAV 875-125 MG TABLET: 875-125 | 4 days supply | Qty: 8 | Fill #0

## 2018-10-10 NOTE — Progress Notes (Signed)
[facility] PROGRESS NOTE Subjective Patient feels fine eating no complaints  Objective: Vital signs in last 24 hours: Temp:  [97.5 F (36.4 C)-98.2 F (36.8 C)] 97.5 F (36.4 C) (10/09 0526) Pulse Rate:  [47-73] 47 (10/09 0526) Resp:  [17-19] 17 (10/09 0526) BP: (128-142)/(88-95) 140/95 (10/09 0823) SpO2:  [96 %-98 %] 98 % (10/09 0526) Weight:  [97.2 kg] 97.2 kg (10/09 0526) Last BM Date: 10/09/18  Intake/Output from previous day: 10/08 0701 - 10/09 0700 In: 2359.6 [P.O.:720; I.V.:1639.6] Out: 1125 [Urine:1125] Intake/Output this shift: No intake/output data recorded.  PE: Abdomen--nontender  Lab Results: Recent Labs    10/09/18 0530  WBC 13.3*  HGB 12.6*  HCT 36.6*  PLT 159   BMET Recent Labs    10/07/18 1233 10/08/18 0154 10/09/18 0530 10/10/18 0522  NA 137 139 139 139  K 4.0 4.0 3.6 3.7  CL 102 106 106 105  CO2 23 21* 24 25  CREATININE 1.99* 1.41* 0.93 0.93   LFT Recent Labs    10/08/18 0154 10/09/18 0530 10/10/18 0522  PROT 5.2* 4.4* 4.5*  AST 4,552* 840* 276*  ALT 6,607* 3,563* 2,338*  ALKPHOS 85 81 82  BILITOT 3.3* 3.5* 2.8*   PT/INR Recent Labs    10/08/18 0154 10/09/18 0530 10/10/18 0522  LABPROT 23.5* 18.7* 16.3*  INR 2.1* 1.6* 1.3*   PANCREAS Recent Labs    10/07/18 1233  LIPASE 652*         Studies/Results: No results found.  Medications: I have reviewed the patient's current medications.  Assessment:   1.  Acute liver injury secondary to Tylenol overdose and chronic alcohol use.  Liver is clearly improving bilirubin down other LFTs improving as well.   Plan: Long discussion with him about the dangers of continued alcohol and Tylenol use.  I do not think he needs any further testing or follow-up at this point. We will sign off please call us back if needed   Nancy Fetter 10/10/2018, 8:57 AM  This note was created using voice recognition software. Minor errors may Have occurred  unintentionally.  Pager: [phone] If no answer or after hours call [phone]

## 2018-10-10 NOTE — Discharge Summary (Signed)
Physician Discharge Summary  Richard George ZOX:[id number] DOB: 13-Dec-1960 DOA: 10/07/2018  PCP: New PCP referral to [facility] Admit date: 10/07/2018 Discharge date: 10/10/2018 Consultations: Gastroenterology Admitted From: home Disposition: home  Discharge Diagnoses:  Principal Problem:   Acute liver failure Active Problems:   Transaminitis   Tooth infection   Polysubstance abuse (HCC)   Dyspepsia   Hospital Course Summary: 58 year old male with history of alcohol abuse/dependence (drinks 9 pack beer per day) recently developed a toothache and was taking 6 to 8 pills of Tylenol several times a day for about 4 to 5 days prior to presentation.  He presented to the ED on 10/6 with complaints of vomiting. Labs obtained here in the emergency room show WBCs 11.4, H&H 14.8 and 44.4 respectively. Total bilirubin 3.8, alkaline phosphatase 93, AST 9128, ALT 8773, ammonia 83, prothrombin time 29.1, INR 2.8, EtOH level greater than 10.patient admitted to ICU with diagnosis of acute liver injury in the setting of excessive Tylenol ingestion/alcohol abuse and N acetylcysteine protocol was instituted along with steroids.  Patient's transaminases peaked to greater than 10,000 subsequent to which they started to downtrend.  He was transferred out of ICU to hospitalist service on 10/8.   1.  Acute liver injury in the setting of alcohol abuse/Tylenol overuse: Alcohol level at 10 and acetaminophen level less than 10 on presentation.  Transaminases improving with AST now down to 300s and ALT down to 2000.  Hepatitis profile/HIV profile negative.  INR improved from 2.8-1.6.  Ammonia level elevated at 59 on 10/6 but mentating well.  Off N-acetylcysteine.  Transfer out of ICU on prednisone-recommended for 28 days initially but d/w Dr Randa Evens with GI today who stated okay to taper off over 1 week. He is doing well without nausea and tolerating diet. Cleared by GI for discharge. SW has set up PCP appointment and  will need repeat labs in 1 week for follow up.    2.  AKI: Likely due to GI losses/vomiting/dehydration.  Improved with IV hydration.  Creatinine on admission at 2.1 and now down to 0.9.  Avoid nephrotoxic agents.  3.  Alcohol abuse/dependence: No signs of withdrawal while here.  Not on CIWA protocol.  Multivitamin/thiamine.  Counseled regarding risks of progression to fulminant liver failure/liver cirrhosis with continued abuse.  4.  Marijuana abuse: Urine drug screen positive for marijuana and patient admits to chronic use.  Counseled to quit.  5.  Jaw pain/tooth infection: Started on Augmentin while here and intend to complete 7-day course as outpatient.  Will need dental outpatient follow-up --patient has contact info/ resources bedside. He has been afebrile and tolareting diet /chewing without pain. Mouthwash prn  6.  Dyspepsia/gastritis: PPI  7. Hypertension: New diagnosis. BP elevated while here and started on  Norvasc while in ICU. Will discharge on same. May improve once acute issues resolve and off steroids. PCP to follow up and titrate as needed.   Discharge Exam:  Vitals:   10/10/18 0526 10/10/18 0823  BP: (!) 142/92 (!) 140/95  Pulse: (!) 47   Resp: 17   Temp: (!) 97.5 F (36.4 C)   SpO2: 98%    Vitals:   10/09/18 1442 10/09/18 2104 10/10/18 0526 10/10/18 0823  BP: 138/88 (!) 128/91 (!) 142/92 (!) 140/95  Pulse: 73 (!) 53 (!) 47   Resp: 19 17 17    Temp: 97.7 F (36.5 C) 98.2 F (36.8 C) (!) 97.5 F (36.4 C)   TempSrc: Oral     SpO2: 96%  98% 98%   Weight:   97.2 kg   Height:        General: Pt is alert, awake, not in acute distress Cardiovascular: RRR, S1/S2 +, no rubs, no gallops Respiratory: CTA bilaterally, no wheezing, no rhonchi Abdominal: Soft, NT, ND, bowel sounds + Extremities: no edema, no cyanosis  Discharge Condition:Stable CODE STATUS:full code Diet recommendation: low salt diet Recommendations for Outpatient Follow-up:  1. Follow up  with PCP: 1 week 2. Follow up with consultants: Dental clinic in 1 week, GI prn 3. Please obtain follow up labs including: LFTs, BMP in 1 week  Home Health services upon discharge:  Equipment/Devices upon discharge:   Discharge Instructions:  Discharge Instructions    Call MD for:  difficulty breathing, headache or visual disturbances   Complete by: As directed    Call MD for:  extreme fatigue   Complete by: As directed    Call MD for:  persistant dizziness or light-headedness   Complete by: As directed    Call MD for:  persistant nausea and vomiting   Complete by: As directed    Call MD for:  severe uncontrolled pain   Complete by: As directed    Call MD for:  temperature >100.4   Complete by: As directed    Diet - low sodium heart healthy   Complete by: As directed    Increase activity slowly   Complete by: As directed      Allergies as of 10/10/2018   No Known Allergies     Medication List    STOP taking these medications   acetaminophen 500 MG tablet Commonly known as: TYLENOL     TAKE these medications   amLODipine 5 MG tablet Commonly known as: NORVASC Take 1 tablet (5 mg total) by mouth daily. Start taking on: October 11, 2018   amoxicillin-clavulanate 875-125 MG tablet Commonly known as: AUGMENTIN Take 1 tablet by mouth every 12 (twelve) hours for 4 days.   lidocaine 2 % solution Commonly known as: XYLOCAINE Use as directed 15 mLs in the mouth or throat every 4 (four) hours as needed for up to 7 days for mouth pain.   predniSONE 10 MG (21) Tbpk tablet Commonly known as: STERAPRED UNI-PAK 21 TAB Taper to off as directed       No Known Allergies    The results of significant diagnostics from this hospitalization (including imaging, microbiology, ancillary and laboratory) are listed below for reference.    Labs: BNP (last 3 results) No results for input(s): BNP in the last 8760 hours. Basic Metabolic Panel: Recent Labs  Lab 10/07/18 0642  10/07/18 1233 10/08/18 0154 10/09/18 0530 10/10/18 0522  NA 138 137 139 139 139  K 4.3 4.0 4.0 3.6 3.7  CL 103 102 106 106 105  CO2 16* 23 21* 24 25  GLUCOSE 119* 92 142* 141* 125*  BUN 35* 37* 26* 22* 18  CREATININE 2.10* 1.99* 1.41* 0.93 0.93  CALCIUM 8.8* 9.0 8.8* 8.4* 8.6*  MG 1.8  --   --   --   --   PHOS 2.6  --   --   --   --    Liver Function Tests: Recent Labs  Lab 10/07/18 0642 10/07/18 1233 10/08/18 0154 10/09/18 0530 10/10/18 0522  AST 9,128* 8,982* 4,552* 840* 276*  ALT 8,773* 8,927* 6,607* 3,563* 2,338*  ALKPHOS 93 93 85 81 82  BILITOT 3.8* 3.8* 3.3* 3.5* 2.8*  PROT 5.7* 5.9* 5.2* 4.4* 4.5*  ALBUMIN  2.9* 3.2* 2.8* 2.4* 2.5*   Recent Labs  Lab 10/07/18 1233  LIPASE 652*   Recent Labs  Lab 10/07/18 0442 10/07/18 1657  AMMONIA 83* 59*   CBC: Recent Labs  Lab 10/07/18 0116 10/07/18 0642 10/09/18 0530  WBC 14.0* 11.4* 13.3*  HGB 17.2* 14.8 12.6*  HCT 51.3 44.4 36.6*  MCV 95.9 96.5 96.3  PLT 188 164 159   Cardiac Enzymes: No results for input(s): CKTOTAL, CKMB, CKMBINDEX, TROPONINI in the last 168 hours. BNP: Invalid input(s): POCBNP CBG: No results for input(s): GLUCAP in the last 168 hours. D-Dimer No results for input(s): DDIMER in the last 72 hours. Hgb A1c No results for input(s): HGBA1C in the last 72 hours. Lipid Profile No results for input(s): CHOL, HDL, LDLCALC, TRIG, CHOLHDL, LDLDIRECT in the last 72 hours. Thyroid function studies No results for input(s): TSH, T4TOTAL, T3FREE, THYROIDAB in the last 72 hours.  Invalid input(s): FREET3 Anemia work up No results for input(s): VITAMINB12, FOLATE, FERRITIN, TIBC, IRON, RETICCTPCT in the last 72 hours. Urinalysis    Component Value Date/Time   COLORURINE AMBER (A) 10/07/2018 0254   APPEARANCEUR CLOUDY (A) 10/07/2018 0254   LABSPEC 1.020 10/07/2018 0254   PHURINE 5.0 10/07/2018 0254   GLUCOSEU NEGATIVE 10/07/2018 0254   HGBUR MODERATE (A) 10/07/2018 0254   BILIRUBINUR SMALL  (A) 10/07/2018 0254   KETONESUR NEGATIVE 10/07/2018 0254   PROTEINUR 100 (A) 10/07/2018 0254   NITRITE NEGATIVE 10/07/2018 0254   LEUKOCYTESUR NEGATIVE 10/07/2018 0254   Sepsis Labs Invalid input(s): PROCALCITONIN,  WBC,  LACTICIDVEN Microbiology Recent Results (from the past 240 hour(s))  Urine Culture     Status: Abnormal   Collection Time: 10/07/18  1:30 AM   Specimen: Urine, Random  Result Value Ref Range Status   Specimen Description   Final    URINE, RANDOM Performed at [facility], [address] [place], [place] [address]    Special Requests   Final    NONE Performed at [facility], [address] [place], [place] [address]    Culture (A)  Final    <10,000 COLONIES/mL INSIGNIFICANT GROWTH Performed at [facility], [address] [place], [place] [address]    Report Status 10/07/2018 FINAL  Final  SARS Coronavirus 2 [facility](Hospital order, Performed in [facility]) Nasopharyngeal Urine, Clean Catch     Status: None   Collection Time: 10/07/18  3:10 AM   Specimen: Urine, Clean Catch; Nasopharyngeal  Result Value Ref Range Status   SARS Coronavirus 2 NEGATIVE NEGATIVE Final    Comment: (NOTE) If result is NEGATIVE SARS-CoV-2 target nucleic acids are NOT DETECTED. The SARS-CoV-2 RNA is generally detectable in upper and lower  respiratory specimens during the acute phase of infection. The lowest  concentration of SARS-CoV-2 viral copies this assay can detect is 250  copies / mL. A negative result does not preclude SARS-CoV-2 infection  and should not be used as the sole basis for treatment or other  patient management decisions.  A negative result may occur with  improper specimen collection / handling, submission of specimen other  than nasopharyngeal swab, presence of viral mutation(s) within the  areas targeted by this assay, and inadequate number of viral copies  (<250 copies / mL). A negative result must be  combined with clinical  observations, patient history, and epidemiological information. If result is POSITIVE SARS-CoV-2 target nucleic acids are DETECTED. The SARS-CoV-2 RNA is generally detectable in upper and lower  respiratory specimens dur ing the acute phase of infection.  Positive  results are indicative of active infection with SARS-CoV-2.  Clinical  correlation with patient history and other diagnostic information is  necessary to determine patient infection status.  Positive results do  not rule out bacterial infection or co-infection with other viruses. If result is PRESUMPTIVE POSTIVE SARS-CoV-2 nucleic acids MAY BE PRESENT.   A presumptive positive result was obtained on the submitted specimen  and confirmed on repeat testing.  While 2019 novel coronavirus  (SARS-CoV-2) nucleic acids may be present in the submitted sample  additional confirmatory testing may be necessary for epidemiological  and / or clinical management purposes  to differentiate between  SARS-CoV-2 and other Sarbecovirus currently known to infect humans.  If clinically indicated additional testing with an alternate test  methodology [phone]) is advised. The SARS-CoV-2 RNA is generally  detectable in upper and lower respiratory sp ecimens during the acute  phase of infection. The expected result is Negative. Fact Sheet for Patients:  BoilerBrush.com.cy Fact Sheet for Healthcare Providers: https://pope.com/ This test is not yet approved or cleared by the [place] FDA and has been authorized for detection and/or diagnosis of SARS-CoV-2 by FDA under an Emergency Use Authorization (EUA).  This EUA will remain in effect (meaning this test can be used) for the duration of the COVID-19 declaration under Section 564(b)(1) of the Act, 21 U.S.C. section 360bbb-3(b)(1), unless the authorization is terminated or revoked sooner. Performed at [facility], [address] [place], [place] [address]   MRSA PCR Screening     Status: None   Collection Time: 10/07/18  4:55 PM   Specimen: Nasal Mucosa; Nasopharyngeal  Result Value Ref Range Status   MRSA by PCR NEGATIVE NEGATIVE Final    Comment:        The GeneXpert MRSA Assay (FDA approved for NASAL specimens only), is one component of a comprehensive MRSA colonization surveillance program. It is not intended to diagnose MRSA infection nor to guide or monitor treatment for MRSA infections. Performed at [facility], [address], [place], [place] [address]     Procedures/Studies: US Abdomen Complete  Result Date: 10/07/2018 CLINICAL DATA:  Transaminitis EXAM: ABDOMEN ULTRASOUND COMPLETE COMPARISON:  None. FINDINGS: Gallbladder: Gallbladder wall is within normal limits at 2.7 mm. There are no stones. No sonographic Eulah Pont sign is reported. There is a small amount of pericholecystic. Common bile duct: Diameter: 2.5 mm, within normal limits Liver: No focal lesion identified. Within normal limits in parenchymal echogenicity. Portal vein is patent on color Doppler imaging with normal direction of blood flow towards the liver. IVC: No abnormality visualized. Pancreas: Visualized portion unremarkable. Spleen: Size and appearance within normal limits. Right Kidney: Length: 11.3 cm, within normal limits. Echogenicity within normal limits. No mass or hydronephrosis visualized. Left Kidney: Length: 11.3 cm, within normal limits. Echogenicity within normal limits. No mass or hydronephrosis visualized. Abdominal aorta: No aneurysm visualized. Other findings: None. IMPRESSION: 1. Small amount of pericholecystic fluid without other signs of acute cholecystitis. 2. Otherwise unremarkable abdominal ultrasound. 3. Normal sonographic appearance of the liver. Electronically Signed   By: Marin Roberts M.D.   On: 10/07/2018 08:09     Time coordinating discharge: Over 30  minutes  SIGNED:   Alessandra Bevels, MD  [facility] 10/10/2018, 1:59 PM Pager : [phone]

## 2018-10-10 NOTE — Discharge Instructions (Signed)
Primary Care Appointment for establishing outpatient PCP: Patient [place] 10/15/18 1:00pm [address] Renee Harder [place], [place] [address] [phone]  Dental clinics/resources: [facility] [email or web address] If you are in severe pain, have bleeding or swelling: Call the [facility] Urgent Care Department at [phone] between 8AM and 5PM. The visit has a modest fee and they can address your problem. Clinic holds intermittent open clinics, however limited due to [place] pandemic. Check website for updates on dates of the clinic  [facility] [email or web address] Mon-Thurs 7am-6pm [address] [place], [place] [address] [phone] Uninsured patients may qualify for assistance programs- to apply: go to clinic, bring documentation of income of every each individual in your household (e.g., social security income, tax return), and request assistance application  [facility]  ThousandTimes.com.ee Locations and Hours vary. Referral is made by primary care physician- PCP must be a [facility], [email or web address]) participating provider. Patient must be enrolled in [facility] and have active orange card. $30 copay regardless of service rendered.  For assistance enrolling in [facility], visit website above, or visit office  [address] [place], [place] [address] [phone]

## 2018-10-15 ENCOUNTER — Encounter: Payer: Self-pay | Admitting: Family Medicine

## 2018-10-15 ENCOUNTER — Ambulatory Visit (INDEPENDENT_AMBULATORY_CARE_PROVIDER_SITE_OTHER): Payer: Self-pay | Admitting: Family Medicine

## 2018-10-15 ENCOUNTER — Other Ambulatory Visit: Payer: Self-pay

## 2018-10-15 VITALS — BP 143/83 | HR 56 | Temp 98.1°F | Ht 71.0 in | Wt 212.8 lb

## 2018-10-15 DIAGNOSIS — F1090 Alcohol use, unspecified, uncomplicated: Secondary | ICD-10-CM

## 2018-10-15 DIAGNOSIS — Z0000 Encounter for general adult medical examination without abnormal findings: Secondary | ICD-10-CM

## 2018-10-15 DIAGNOSIS — R609 Edema, unspecified: Secondary | ICD-10-CM

## 2018-10-15 DIAGNOSIS — Z789 Other specified health status: Secondary | ICD-10-CM

## 2018-10-15 DIAGNOSIS — Z7289 Other problems related to lifestyle: Secondary | ICD-10-CM

## 2018-10-15 DIAGNOSIS — K047 Periapical abscess without sinus: Secondary | ICD-10-CM

## 2018-10-15 DIAGNOSIS — T391X1A Poisoning by 4-Aminophenol derivatives, accidental (unintentional), initial encounter: Secondary | ICD-10-CM

## 2018-10-15 DIAGNOSIS — Z09 Encounter for follow-up examination after completed treatment for conditions other than malignant neoplasm: Secondary | ICD-10-CM

## 2018-10-15 DIAGNOSIS — Z7689 Persons encountering health services in other specified circumstances: Secondary | ICD-10-CM

## 2018-10-15 DIAGNOSIS — Z1211 Encounter for screening for malignant neoplasm of colon: Secondary | ICD-10-CM

## 2018-10-15 DIAGNOSIS — Z720 Tobacco use: Secondary | ICD-10-CM

## 2018-10-15 DIAGNOSIS — I10 Essential (primary) hypertension: Secondary | ICD-10-CM

## 2018-10-15 DIAGNOSIS — R600 Localized edema: Secondary | ICD-10-CM

## 2018-10-15 DIAGNOSIS — F1290 Cannabis use, unspecified, uncomplicated: Secondary | ICD-10-CM

## 2018-10-15 LAB — GLUCOSE, POCT (MANUAL RESULT ENTRY): POC Glucose: 127 mg/dl — AB (ref 70–99)

## 2018-10-15 LAB — POCT URINALYSIS DIPSTICK
Bilirubin, UA: NEGATIVE
Glucose, UA: NEGATIVE
Ketones, UA: NEGATIVE
Leukocytes, UA: NEGATIVE
Nitrite, UA: NEGATIVE
Protein, UA: NEGATIVE
Spec Grav, UA: 1.02 (ref 1.010–1.025)
Urobilinogen, UA: 0.2 E.U./dL
pH, UA: 8.5 — AB (ref 5.0–8.0)

## 2018-10-15 LAB — POCT GLYCOSYLATED HEMOGLOBIN (HGB A1C): Hemoglobin A1C: 5.6 % (ref 4.0–5.6)

## 2018-10-15 MED ORDER — AMOXICILLIN-POT CLAVULANATE 875-125 MG PO TABS: 1.0000 | ORAL_TABLET | Freq: Two times a day (BID) | ORAL | 0 refills | Status: AC

## 2018-10-15 MED ORDER — LISINOPRIL 10 MG PO TABS: 10.0000 mg | ORAL_TABLET | Freq: Every day | ORAL | 3 refills | Status: DC

## 2018-10-15 MED ORDER — AMLODIPINE BESYLATE 5 MG PO TABS: 5.0000 mg | ORAL_TABLET | Freq: Every day | ORAL | 3 refills | Status: DC

## 2018-10-15 MED FILL — LISINOPRIL 10 MG TABS: 10 | 30 days supply | Qty: 30 | Fill #0

## 2018-10-15 MED FILL — AMOX-CLAV 875-125 MG TABLET: 875-125 | 10 days supply | Qty: 20 | Fill #0

## 2018-10-15 NOTE — Progress Notes (Signed)
[facility]   Established Patient Office Visit  Subjective:  Patient ID: Richard George, male    DOB: 1960-04-26  Age: 58 y.o. MRN: [id number]  CC:  Chief Complaint  Patient presents with  . Hospitalization Follow-up    C/o fluid retention & dark stools    HPI Richard George is a 58 year old male who presents for Hospital Follow Up and the Establish Care today.   Past Medical History:  Diagnosis Date  . Alcohol use 10/2018  . Fistula, anal 1999  . Hypertension 10/2018  . Marijuana use 10/2018  . Peripheral edema 10/2018  . Tobacco use 10/2018    Past Surgical History:  Procedure Laterality Date  . HEMORRHOID SURGERY     Current Status: This will be Richard George initial office visit with me. He was previously not seeing a physician for his PCP needs. Since his last office visit, he has had an Hospital Admission for Accidental Acetaminophen Overdose from 10/07/2018-10/10/2018. Today, he is doing well with no complaints. 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 He is currently smoking 1/2 pack of cigarettes a day. He has only drinking one 24 oz alcoholic beverage since his discharge from Hospital. He denies fevers, chills, fatigue, recent infections, weight loss, and night sweats. He has not had any headaches, visual changes, dizziness, and falls. No chest pain, heart palpitations, cough and shortness of breath reported. No reports of GI problems such as nausea, vomiting, diarrhea, and constipation. He has no reports of blood in stools, dysuria and hematuria. No depression or anxiety reported today.  He denies pain today.   No family history on file.  Social History   Socioeconomic History  . Marital status: Divorced    Spouse name: Not on file  . Number of children: Not on file  . Years of education: Not on file  . Highest education level: Not on file  Occupational  History  . Not on file  Social Needs  . Financial resource strain: Not on file  . Food insecurity    Worry: Not on file    Inability: Not on file  . Transportation needs    Medical: Not on file    Non-medical: Not on file  Tobacco Use  . Smoking status: Current Every Day Smoker    Years: 41.00    Types: Cigarettes  . Smokeless tobacco: Never Used  Substance and Sexual Activity  . Alcohol use: Yes  . Drug use: Yes    Types: Cocaine  . Sexual activity: Not Currently  Lifestyle  . Physical activity    Days per week: Not on file    Minutes per session: Not on file  . Stress: Not on file  Relationships  . Social Musician on phone: Not on file    Gets together: Not on file    Attends religious service: Not on file    Active member of club or organization: Not on file    Attends meetings of clubs or organizations: Not on file    Relationship status: Not on file  . Intimate partner violence    Fear of current or ex partner: Not on file    Emotionally abused: Not on file    Physically abused: Not on file    Forced sexual activity: Not on file  Other Topics Concern  . Not on  file  Social History Narrative  . Not on file    Outpatient Medications Prior to Visit  Medication Sig Dispense Refill  . amLODipine (NORVASC) 5 MG tablet Take 1 tablet (5 mg total) by mouth daily. 30 tablet 1  . lidocaine (XYLOCAINE) 2 % solution Use as directed 15 mLs in the mouth or throat every 4 (four) hours as needed for up to 7 days for mouth pain. (Patient not taking: Reported on 10/15/2018) 100 mL 0  . predniSONE (STERAPRED UNI-PAK 21 TAB) 10 MG (21) TBPK tablet Taper to off as directed 21 tablet 0   No facility-administered medications prior to visit.     No Known Allergies  ROS Review of Systems  Constitutional: Positive for fatigue.  HENT: Negative.   Eyes: Negative.   Respiratory: Negative.   Cardiovascular: Negative.   Gastrointestinal: Negative.   Endocrine:  Negative.   Genitourinary: Negative.   Musculoskeletal: Positive for back pain (chronic low back pain).  Skin: Negative.   Allergic/Immunologic: Negative.   Neurological: Positive for dizziness (occasional ), weakness and numbness (and tingling bilateral legs).  Psychiatric/Behavioral: Negative.       Objective:    Physical Exam  Constitutional: He is oriented to person, place, and time. He appears well-developed and well-nourished.  HENT:  Head: Normocephalic and atraumatic.  Eyes: Conjunctivae are normal.  Neck: Normal range of motion. Neck supple.  Cardiovascular: Normal rate, regular rhythm, normal heart sounds and intact distal pulses.  Pulmonary/Chest: Effort normal and breath sounds normal.  Abdominal: Soft. Bowel sounds are normal.  Musculoskeletal:     Comments: Limited ROM in spine  Neurological: He is alert and oriented to person, place, and time. He has normal reflexes.  Skin: Skin is dry.  Psychiatric: He has a normal mood and affect. His behavior is normal. Judgment and thought content normal.  Vitals reviewed.   BP (!) 143/83 (BP Location: Left Arm, Patient Position: Sitting, Cuff Size: Large)   Pulse (!) 56   Temp 98.1 F (36.7 C) (Oral)   Ht 5\' 11"  (1.803 m)   Wt 212 lb 12.8 oz (96.5 kg)   SpO2 98%   BMI 29.68 kg/m  Wt Readings from Last 3 Encounters:  10/15/18 212 lb 12.8 oz (96.5 kg)  10/10/18 214 lb 4.6 oz (97.2 kg)     Health Maintenance Due  Topic Date Due  . TETANUS/TDAP  02/05/1981  . COLONOSCOPY  02/06/2012  . INFLUENZA VACCINE  08/02/2018    There are no preventive care reminders to display for this patient.  Lab Results  Component Value Date   TSH 2.800 10/15/2018   Lab Results  Component Value Date   WBC 13.3 (H) 10/09/2018   HGB 12.6 (L) 10/09/2018   HCT 36.6 (L) 10/09/2018   MCV 96.3 10/09/2018   PLT 159 10/09/2018   Lab Results  Component Value Date   NA 139 10/10/2018   K 3.7 10/10/2018   CO2 25 10/10/2018   GLUCOSE  125 (H) 10/10/2018   BUN 18 10/10/2018   CREATININE 0.93 10/10/2018   BILITOT 2.8 (H) 10/10/2018   ALKPHOS 82 10/10/2018   AST 276 (H) 10/10/2018   ALT 2,338 (H) 10/10/2018   PROT 4.5 (L) 10/10/2018   ALBUMIN 2.5 (L) 10/10/2018   CALCIUM 8.6 (L) 10/10/2018   ANIONGAP 9 10/10/2018   Lab Results  Component Value Date   CHOL 197 10/15/2018   Lab Results  Component Value Date   HDL 61 10/15/2018   Lab Results  Component Value Date   LDLCALC 121 (H) 10/15/2018   Lab Results  Component Value Date   TRIG 85 10/15/2018   Lab Results  Component Value Date   CHOLHDL 3.2 10/15/2018   Lab Results  Component Value Date   HGBA1C 5.6 10/15/2018      Assessment & Plan:   1. Hospital discharge follow-up  2. Encounter to establish care  3. Accidental acetaminophen overdose, initial encounter Resolved. He continues to have mild fatigue. We will continue to monitor.   4. Alcohol use  5. Tobacco use  6. Marijuana use  7. Essential hypertension The current medical regimen is effective; blood pressure is stable at 143/83 today; continue present plan and medications as prescribed. He will continue to take medications as prescribed, to decrease high sodium intake, excessive alcohol intake, increase potassium intake, smoking cessation, and increase physical activity of at least 30 minutes of cardio activity daily. He will continue to follow Heart Healthy or DASH diet. - lisinopril (ZESTRIL) 10 MG tablet; Take 1 tablet (10 mg total) by mouth daily.  Dispense: 30 tablet; Refill: 3  8. Tooth abscess We will initiate antibiotic today. He will continue search for affordable dentist.  - amoxicillin-clavulanate (AUGMENTIN) 875-125 MG tablet; Take 1 tablet by mouth every 12 (twelve) hours for 10 days.  Dispense: 20 tablet; Refill: 0  9. Peripheral edema Stable.   10. Screening for colon cancer We will assess at next office visit.   11. Healthcare maintenance - POCT urinalysis  dipstick - POCT glycosylated hemoglobin (Hb A1C) - POCT glucose (manual entry) - Lipid Panel - PSA - TSH - Vitamin B12 - Vitamin D, 25-hydroxy - HepB+HepC+HIV Panel - RPR  12. Follow up He will follow up in 3 months.   Meds ordered this encounter  Medications  . DISCONTD: amLODipine (NORVASC) 5 MG tablet    Sig: Take 1 tablet (5 mg total) by mouth daily.    Dispense:  30 tablet    Refill:  3  . DISCONTD: lisinopril (ZESTRIL) 10 MG tablet    Sig: Take 1 tablet (10 mg total) by mouth daily.    Dispense:  30 tablet    Refill:  3  . amoxicillin-clavulanate (AUGMENTIN) 875-125 MG tablet    Sig: Take 1 tablet by mouth every 12 (twelve) hours for 10 days.    Dispense:  20 tablet    Refill:  0  . lisinopril (ZESTRIL) 10 MG tablet    Sig: Take 1 tablet (10 mg total) by mouth daily.    Dispense:  30 tablet    Refill:  3    Orders Placed This Encounter  Procedures  . Lipid Panel  . PSA  . TSH  . Vitamin B12  . Vitamin D, 25-hydroxy  . HepB+HepC+HIV Panel  . RPR  . POCT urinalysis dipstick  . POCT glycosylated hemoglobin (Hb A1C)  . POCT glucose (manual entry)   Problem List Items Addressed This Visit    None    Visit Diagnoses    Hospital discharge follow-up    -  Primary   Encounter to establish care       Accidental acetaminophen overdose, initial encounter       Alcohol use       Tobacco use       Marijuana use       Essential hypertension       Relevant Medications   lisinopril (ZESTRIL) 10 MG tablet   Tooth abscess  Relevant Medications   amoxicillin-clavulanate (AUGMENTIN) 875-125 MG tablet   Peripheral edema       Screening for colon cancer       Healthcare maintenance       Relevant Orders   POCT urinalysis dipstick (Completed)   POCT glycosylated hemoglobin (Hb A1C) (Completed)   POCT glucose (manual entry) (Completed)   Lipid Panel (Completed)   PSA (Completed)   TSH (Completed)   Vitamin B12 (Completed)   Vitamin D, 25-hydroxy (Completed)    HepB+HepC+HIV Panel (Completed)   RPR (Completed)   Follow up          Meds ordered this encounter  Medications  . DISCONTD: amLODipine (NORVASC) 5 MG tablet    Sig: Take 1 tablet (5 mg total) by mouth daily.    Dispense:  30 tablet    Refill:  3  . DISCONTD: lisinopril (ZESTRIL) 10 MG tablet    Sig: Take 1 tablet (10 mg total) by mouth daily.    Dispense:  30 tablet    Refill:  3  . amoxicillin-clavulanate (AUGMENTIN) 875-125 MG tablet    Sig: Take 1 tablet by mouth every 12 (twelve) hours for 10 days.    Dispense:  20 tablet    Refill:  0  . lisinopril (ZESTRIL) 10 MG tablet    Sig: Take 1 tablet (10 mg total) by mouth daily.    Dispense:  30 tablet    Refill:  3    Follow-up: Return in about 3 months (around 01/15/2019).    Azzie Glatter, FNP

## 2018-10-15 NOTE — Patient Instructions (Signed)
Dental Abscess  A dental abscess is an area of pus in or around a tooth. It comes from an infection. It can cause pain and other symptoms. Treatment will help with symptoms and prevent the infection from spreading. Follow these instructions at home: Medicines  Take over-the-counter and prescription medicines only as told by your dentist.  If you were prescribed an antibiotic medicine, take it as told by your dentist. Do not stop taking it even if you start to feel better.  If you were prescribed a gel that has numbing medicine in it, use it exactly as told.  Do not drive or use heavy machinery (like a Surveyor, mining) while taking prescription pain medicine. General instructions  Rinse out your mouth often with salt water. ? To make salt water, dissolve -1 tsp of salt in 1 cup of warm water.  Eat a soft diet while your mouth is healing.  Drink enough fluid to keep your urine pale yellow.  Do not apply heat to the outside of your mouth.  Do not use any products that contain nicotine or tobacco. These include cigarettes and e-cigarettes. If you need help quitting, ask your doctor.  Keep all follow-up visits as told by your dentist. This is important. Prevent an abscess  Brush your teeth every morning and every night. Use fluoride toothpaste.  Floss your teeth each day.  Get dental cleanings as often as told by your dentist.  Think about getting dental sealant put on teeth that have deep holes (decay).  Drink water that has fluoride in it. ? Most tap water has fluoride. ? Check the label on bottled water to see if it has fluoride in it.  Drink water instead of sugary drinks.  Eat healthy meals and snacks.  Wear a mouth guard or face shield when you play sports. Contact a doctor if:  Your pain is worse, and medicine does not help. Get help right away if:  You have a fever or chills.  Your symptoms suddenly get worse.  You have a very bad headache.  You have problems  breathing or swallowing.  You have trouble opening your mouth.  You have swelling in your neck or close to your eye. Summary  A dental abscess is an area of pus in or around a tooth. It is caused by an infection.  Treatment will help with symptoms and prevent the infection from spreading.  Take over-the-counter and prescription medicines only as told by your dentist.  To prevent an abscess, take good care of your teeth. Brush your teeth every morning and night. Use floss every day.  Get dental cleanings as often as told by your dentist. This information is not intended to replace advice given to you by your health care provider. Make sure you discuss any questions you have with your health care provider. Document Released: 05/04/2014 Document Revised: 04/09/2018 Document Reviewed: 08/20/2016 Elsevier Patient Education  2020 Elsevier Inc. Amoxicillin; Clavulanic Acid tablets What is this medicine? AMOXICILLIN; CLAVULANIC ACID (a mox i SIL in; KLAV yoo lan ic AS id) is a penicillin antibiotic. It is used to treat certain kinds of bacterial infections. It will not work for colds, flu, or other viral infections. This medicine may be used for other purposes; ask your health care provider or pharmacist if you have questions. COMMON BRAND NAME(S): Augmentin What should I tell my health care provider before I take this medicine? They need to know if you have any of these conditions:  bowel disease,  like colitis  kidney disease  liver disease  mononucleosis  an unusual or allergic reaction to amoxicillin, penicillin, cephalosporin, other antibiotics, clavulanic acid, other medicines, foods, dyes, or preservatives  pregnant or trying to get pregnant  breast-feeding How should I use this medicine? Take this medicine by mouth with a full glass of water. Follow the directions on the prescription label. Take at the start of a meal. Do not crush or chew. If the tablet has a score line, you  may cut it in half at the score line for easier swallowing. Take your medicine at regular intervals. Do not take your medicine more often than directed. Take all of your medicine as directed even if you think you are better. Do not skip doses or stop your medicine early. Talk to your pediatrician regarding the use of this medicine in children. Special care may be needed. Overdosage: If you think you have taken too much of this medicine contact a poison control center or emergency room at once. NOTE: This medicine is only for you. Do not share this medicine with others. What if I miss a dose? If you miss a dose, take it as soon as you can. If it is almost time for your next dose, take only that dose. Do not take double or extra doses. What may interact with this medicine?  allopurinol  anticoagulants  birth control pills  methotrexate  probenecid This list may not describe all possible interactions. Give your health care provider a list of all the medicines, herbs, non-prescription drugs, or dietary supplements you use. Also tell them if you smoke, drink alcohol, or use illegal drugs. Some items may interact with your medicine. What should I watch for while using this medicine? Tell your doctor or healthcare provider if your symptoms do not improve. This medicine may cause serious skin reactions. They can happen weeks to months after starting the medicine. Contact your healthcare provider right away if you notice fevers or flu-like symptoms with a rash. The rash may be red or purple and then turn into blisters or peeling of the skin. Or, you might notice a red rash with swelling of the face, lips or lymph nodes in your neck or under your arms. Do not treat diarrhea with over the counter products. Contact your doctor if you have diarrhea that lasts more than 2 days or if it is severe and watery. If you have diabetes, you may get a false-positive result for sugar in your urine. Check with your  doctor or healthcare provider. Birth control pills may not work properly while you are taking this medicine. Talk to your doctor about using an extra method of birth control. What side effects may I notice from receiving this medicine? Side effects that you should report to your doctor or health care professional as soon as possible:  allergic reactions like skin rash, itching or hives, swelling of the face, lips, or tongue  breathing problems  dark urine  fever or chills, sore throat  redness, blistering, peeling, or loosening of the skin, including inside the mouth  seizures  trouble passing urine or change in the amount of urine  unusual bleeding, bruising  unusually weak or tired  white patches or sores in the mouth or throat Side effects that usually do not require medical attention (report to your doctor or health care professional if they continue or are bothersome):  diarrhea  dizziness  headache  nausea, vomiting  stomach upset  vaginal or anal irritation  This list may not describe all possible side effects. Call your doctor for medical advice about side effects. You may report side effects to FDA at 1-800-FDA-1088. Where should I keep my medicine? Keep out of the reach of children. Store at room temperature below 25 degrees C (77 degrees F). Keep container tightly closed. Throw away any unused medicine after the expiration date. NOTE: This sheet is a summary. It may not cover all possible information. If you have questions about this medicine, talk to your doctor, pharmacist, or health care provider.  2020 Elsevier/Gold Standard (2018-03-03 09:43:46) Hypertension, Adult Hypertension is another name for high blood pressure. High blood pressure forces your heart to work harder to pump blood. This can cause problems over time. There are two numbers in a blood pressure reading. There is a top number (systolic) over a bottom number (diastolic). It is best to have a  blood pressure that is below 120/80. Healthy choices can help lower your blood pressure, or you may need medicine to help lower it. What are the causes? The cause of this condition is not known. Some conditions may be related to high blood pressure. What increases the risk?  Smoking.  Having type 2 diabetes mellitus, high cholesterol, or both.  Not getting enough exercise or physical activity.  Being overweight.  Having too much fat, sugar, calories, or salt (sodium) in your diet.  Drinking too much alcohol.  Having long-term (chronic) kidney disease.  Having a family history of high blood pressure.  Age. Risk increases with age.  Race. You may be at higher risk if you are African American.  Gender. Men are at higher risk than women before age 56. After age 56, women are at higher risk than men.  Having obstructive sleep apnea.  Stress. What are the signs or symptoms?  High blood pressure may not cause symptoms. Very high blood pressure (hypertensive crisis) may cause: ? Headache. ? Feelings of worry or nervousness (anxiety). ? Shortness of breath. ? Nosebleed. ? A feeling of being sick to your stomach (nausea). ? Throwing up (vomiting). ? Changes in how you see. ? Very bad chest pain. ? Seizures. How is this treated?  This condition is treated by making healthy lifestyle changes, such as: ? Eating healthy foods. ? Exercising more. ? Drinking less alcohol.  Your health care provider may prescribe medicine if lifestyle changes are not enough to get your blood pressure under control, and if: ? Your top number is above 130. ? Your bottom number is above 80.  Your personal target blood pressure may vary. Follow these instructions at home: Eating and drinking   If told, follow the DASH eating plan. To follow this plan: ? Fill one half of your plate at each meal with fruits and vegetables. ? Fill one fourth of your plate at each meal with whole grains. Whole  grains include whole-wheat pasta, brown rice, and whole-grain bread. ? Eat or drink low-fat dairy products, such as skim milk or low-fat yogurt. ? Fill one fourth of your plate at each meal with low-fat (lean) proteins. Low-fat proteins include fish, chicken without skin, eggs, beans, and tofu. ? Avoid fatty meat, cured and processed meat, or chicken with skin. ? Avoid pre-made or processed food.  Eat less than 1,500 mg of salt each day.  Do not drink alcohol if: ? Your doctor tells you not to drink. ? You are pregnant, may be pregnant, or are planning to become pregnant.  If you drink alcohol: ?  Limit how much you use to:  0-1 drink a day for women.  0-2 drinks a day for men. ? Be aware of how much alcohol is in your drink. In the [place] one drink equals one 12 oz bottle of beer (355 mL), one 5 oz glass of wine (148 mL), or one 1 oz glass of hard liquor (44 mL). Lifestyle   Work with your doctor to stay at a healthy weight or to lose weight. Ask your doctor what the best weight is for you.  Get at least 30 minutes of exercise most days of the week. This may include walking, swimming, or biking.  Get at least 30 minutes of exercise that strengthens your muscles (resistance exercise) at least 3 days a week. This may include lifting weights or doing Pilates.  Do not use any products that contain nicotine or tobacco, such as cigarettes, e-cigarettes, and chewing tobacco. If you need help quitting, ask your doctor.  Check your blood pressure at home as told by your doctor.  Keep all follow-up visits as told by your doctor. This is important. Medicines  Take over-the-counter and prescription medicines only as told by your doctor. Follow directions carefully.  Do not skip doses of blood pressure medicine. The medicine does not work as well if you skip doses. Skipping doses also puts you at risk for problems.  Ask your doctor about side effects or reactions to medicines that you  should watch for. Contact a doctor if you:  Think you are having a reaction to the medicine you are taking.  Have headaches that keep coming back (recurring).  Feel dizzy.  Have swelling in your ankles.  Have trouble with your vision. Get help right away if you:  Get a very bad headache.  Start to feel mixed up (confused).  Feel weak or numb.  Feel faint.  Have very bad pain in your: ? Chest. ? Belly (abdomen).  Throw up more than once.  Have trouble breathing. Summary  Hypertension is another name for high blood pressure.  High blood pressure forces your heart to work harder to pump blood.  For most people, a normal blood pressure is less than 120/80.  Making healthy choices can help lower blood pressure. If your blood pressure does not get lower with healthy choices, you may need to take medicine. This information is not intended to replace advice given to you by your health care provider. Make sure you discuss any questions you have with your health care provider. Document Released: 06/06/2007 Document Revised: 08/28/2017 Document Reviewed: 08/28/2017 Elsevier Patient Education  [employer]. Lisinopril tablets What is this medicine? LISINOPRIL (lyse IN oh pril) is an ACE inhibitor. This medicine is used to treat high blood pressure and heart failure. It is also used to protect the heart immediately after a heart attack. This medicine may be used for other purposes; ask your health care provider or pharmacist if you have questions. COMMON BRAND NAME(S): Prinivil, Zestril What should I tell my health care provider before I take this medicine? They need to know if you have any of these conditions:  diabetes  heart or blood vessel disease  kidney disease  low blood pressure  previous swelling of the tongue, face, or lips with difficulty breathing, difficulty swallowing, hoarseness, or tightening of the throat  an unusual or allergic reaction to  lisinopril, other ACE inhibitors, insect venom, foods, dyes, or preservatives  pregnant or trying to get pregnant  breast-feeding How should I  use this medicine? Take this medicine by mouth with a glass of water. Follow the directions on your prescription label. You may take this medicine with or without food. If it upsets your stomach, take it with food. Take your medicine at regular intervals. Do not take it more often than directed. Do not stop taking except on your doctor's advice. Talk to your pediatrician regarding the use of this medicine in children. Special care may be needed. While this drug may be prescribed for children as young as 64 years of age for selected conditions, precautions do apply. Overdosage: If you think you have taken too much of this medicine contact a poison control center or emergency room at once. NOTE: This medicine is only for you. Do not share this medicine with others. What if I miss a dose? If you miss a dose, take it as soon as you can. If it is almost time for your next dose, take only that dose. Do not take double or extra doses. What may interact with this medicine? Do not take this medicine with any of the following medications:  hymenoptera venom  sacubitril; valsartan This medicines may also interact with the following medications:  aliskiren  angiotensin receptor blockers, like losartan or valsartan  certain medicines for diabetes  diuretics  everolimus  gold compounds  lithium  NSAIDs, medicines for pain and inflammation, like ibuprofen or naproxen  potassium salts or supplements  salt substitutes  sirolimus  temsirolimus This list may not describe all possible interactions. Give your health care provider a list of all the medicines, herbs, non-prescription drugs, or dietary supplements you use. Also tell them if you smoke, drink alcohol, or use illegal drugs. Some items may interact with your medicine. What should I watch for  while using this medicine? Visit your doctor or health care professional for regular check ups. Check your blood pressure as directed. Ask your doctor what your blood pressure should be, and when you should contact him or her. Do not treat yourself for coughs, colds, or pain while you are using this medicine without asking your doctor or health care professional for advice. Some ingredients may increase your blood pressure. Women should inform their doctor if they wish to become pregnant or think they might be pregnant. There is a potential for serious side effects to an unborn child. Talk to your health care professional or pharmacist for more information. Check with your doctor or health care professional if you get an attack of severe diarrhea, nausea and vomiting, or if you sweat a lot. The loss of too much body fluid can make it dangerous for you to take this medicine. You may get drowsy or dizzy. Do not drive, use machinery, or do anything that needs mental alertness until you know how this drug affects you. Do not stand or sit up quickly, especially if you are an older patient. This reduces the risk of dizzy or fainting spells. Alcohol can make you more drowsy and dizzy. Avoid alcoholic drinks. Avoid salt substitutes unless you are told otherwise by your doctor or health care professional. What side effects may I notice from receiving this medicine? Side effects that you should report to your doctor or health care professional as soon as possible:  allergic reactions like skin rash, itching or hives, swelling of the hands, feet, face, lips, throat, or tongue  breathing problems  signs and symptoms of kidney injury like trouble passing urine or change in the amount of urine  signs  and symptoms of increased potassium like muscle weakness; chest pain; or fast, irregular heartbeat  signs and symptoms of liver injury like dark yellow or brown urine; general ill feeling or flu-like symptoms;  light-colored stools; loss of appetite; nausea; right upper belly pain; unusually weak or tired; yellowing of the eyes or skin  signs and symptoms of low blood pressure like dizziness; feeling faint or lightheaded, falls; unusually weak or tired  stomach pain with or without nausea and vomiting Side effects that usually do not require medical attention (report to your doctor or health care professional if they continue or are bothersome):  changes in taste  cough  dizziness  fever  headache  sensitivity to light This list may not describe all possible side effects. Call your doctor for medical advice about side effects. You may report side effects to FDA at 1-800-FDA-1088. Where should I keep my medicine? Keep out of the reach of children. Store at room temperature between 15 and 30 degrees C (59 and 86 degrees F). Protect from moisture. Keep container tightly closed. Throw away any unused medicine after the expiration date. NOTE: This sheet is a summary. It may not cover all possible information. If you have questions about this medicine, talk to your doctor, pharmacist, or health care provider.  2020 Elsevier/Gold Standard (2015-02-07 12:52:35)

## 2018-10-16 LAB — HEPB+HEPC+HIV PANEL
HIV Screen 4th Generation wRfx: NONREACTIVE
Hep B C IgM: NEGATIVE
Hep B Core Total Ab: NEGATIVE
Hep B E Ab: NEGATIVE
Hep B E Ag: NEGATIVE
Hep B Surface Ab, Qual: NONREACTIVE
Hep C Virus Ab: <0.1 s/co ratio (ref 0.0–0.9)
Hepatitis B Surface Ag: NEGATIVE

## 2018-10-16 LAB — VITAMIN D 25 HYDROXY (VIT D DEFICIENCY, FRACTURES): Vit D, 25-Hydroxy: 16.8 ng/mL — ABNORMAL LOW (ref 30.0–100.0)

## 2018-10-16 LAB — TSH: TSH: 2.8 u[IU]/mL (ref 0.450–4.500)

## 2018-10-16 LAB — LIPID PANEL
Chol/HDL Ratio: 3.2 ratio (ref 0.0–5.0)
Cholesterol, Total: 197 mg/dL (ref 100–199)
HDL: 61 mg/dL (ref >39)
LDL Chol Calc (NIH): 121 mg/dL — ABNORMAL HIGH (ref 0–99)
Triglycerides: 85 mg/dL (ref 0–149)
VLDL Cholesterol Cal: 15 mg/dL (ref 5–40)

## 2018-10-16 LAB — RPR: RPR Ser Ql: NONREACTIVE

## 2018-10-16 LAB — PSA: Prostate Specific Ag, Serum: 0.9 ng/mL (ref 0.0–4.0)

## 2018-10-16 LAB — VITAMIN B12: Vitamin B-12: 1055 pg/mL (ref 232–1245)

## 2018-10-18 DIAGNOSIS — F1090 Alcohol use, unspecified, uncomplicated: Secondary | ICD-10-CM | POA: Insufficient documentation

## 2018-10-18 DIAGNOSIS — R600 Localized edema: Secondary | ICD-10-CM | POA: Insufficient documentation

## 2018-10-18 DIAGNOSIS — Z789 Other specified health status: Secondary | ICD-10-CM | POA: Insufficient documentation

## 2018-10-18 DIAGNOSIS — Z7289 Other problems related to lifestyle: Secondary | ICD-10-CM | POA: Insufficient documentation

## 2018-10-18 DIAGNOSIS — I10 Essential (primary) hypertension: Secondary | ICD-10-CM | POA: Insufficient documentation

## 2018-10-18 DIAGNOSIS — F1290 Cannabis use, unspecified, uncomplicated: Secondary | ICD-10-CM | POA: Insufficient documentation

## 2018-10-18 DIAGNOSIS — K047 Periapical abscess without sinus: Secondary | ICD-10-CM | POA: Insufficient documentation

## 2018-10-18 DIAGNOSIS — Z720 Tobacco use: Secondary | ICD-10-CM | POA: Insufficient documentation

## 2018-10-18 DIAGNOSIS — R609 Edema, unspecified: Secondary | ICD-10-CM | POA: Insufficient documentation

## 2019-01-14 ENCOUNTER — Ambulatory Visit: Payer: Self-pay | Admitting: Family Medicine

## 2020-12-26 ENCOUNTER — Encounter (HOSPITAL_COMMUNITY): Payer: Self-pay

## 2020-12-26 ENCOUNTER — Emergency Department (HOSPITAL_COMMUNITY): Payer: Medicaid Other

## 2020-12-26 ENCOUNTER — Other Ambulatory Visit: Payer: Self-pay

## 2020-12-26 ENCOUNTER — Emergency Department (HOSPITAL_COMMUNITY)
Admission: EM | Admit: 2020-12-26 | Discharge: 2020-12-26 | Discharge status: Against Medical Advice | Payer: Medicaid Other | Attending: Emergency Medicine | Admitting: Emergency Medicine

## 2020-12-26 DIAGNOSIS — Z20822 Contact with and (suspected) exposure to covid-19: Secondary | ICD-10-CM | POA: Insufficient documentation

## 2020-12-26 DIAGNOSIS — R059 Cough, unspecified: Secondary | ICD-10-CM | POA: Insufficient documentation

## 2020-12-26 DIAGNOSIS — Z5321 Procedure and treatment not carried out due to patient leaving prior to being seen by health care provider: Secondary | ICD-10-CM | POA: Insufficient documentation

## 2020-12-26 LAB — RESP PANEL BY RT-PCR (FLU A&B, COVID) ARPGX2
Influenza A by PCR: NEGATIVE
Influenza B by PCR: NEGATIVE
SARS Coronavirus 2 by RT PCR: NEGATIVE

## 2020-12-26 NOTE — ED Provider Notes (Signed)
Emergency Medicine Provider Triage Evaluation Note  Lamoine Magallon , a 60 y.o. male  was evaluated in triage.  Pt complains of cough, fever, and chills.  Cough has been present over the last 1 to 2 weeks.  Cough is producing clear mucus.  He states that he has also had fevers and chills.  States that his fever was 103 F 3 days prior.  Endorses sick contacts but states that individuals have not been tested for COVID-19 or influenza.  Review of Systems  Positive: Fever, chills, cough, nasal congestion Negative: Abdominal pain, nausea, vomiting, diarrhea, dysuria, hematuria, urinary urgency  Physical Exam  BP (!) 160/103 (BP Location: Left Arm)    Pulse 81    Temp 99.1 F (37.3 C) (Oral)    Resp 18    SpO2 100%  Gen:   Awake, no distress   Resp:  Normal effort, lungs clear to auscultation bilaterally MSK:   Moves extremities without difficulty  Other:  Abdomen soft, nondistended, nontender.  Medical Decision Making  Medically screening exam initiated at 5:21 PM.  Appropriate orders placed.  Davidmichael Marietta was informed that the remainder of the evaluation will be completed by another provider, this initial triage assessment does not replace that evaluation, and the importance of remaining in the ED until their evaluation is complete.     Haskel Schroeder, PA-C 12/26/20 1722    Sloan Leiter, DO 12/28/20 [phone]

## 2020-12-26 NOTE — ED Triage Notes (Signed)
Pt c/o cough lasting "too long", pt unable to tell this RN how long, "its been weeks". Pt states he was running a fever last week.

## 2020-12-28 ENCOUNTER — Ambulatory Visit: Payer: Self-pay | Admitting: *Deleted

## 2020-12-28 NOTE — Telephone Encounter (Signed)
Pt called in requesting his test results from his ED visit at [facility].   I let him know his flu A&B and Covid tests were negative.   He had a chest x ray result also however I let him know he needed to contact his PCP.   He said he did not have a PCP but according to his chart he is established with Raliegh Ip, FNP with [facility].  He remember that once I told him who she was and when he saw her.   I gave him the information to contact her for an appt. He thanked me very much for my help.

## 2021-01-12 ENCOUNTER — Other Ambulatory Visit (HOSPITAL_COMMUNITY): Payer: Self-pay

## 2021-01-12 ENCOUNTER — Other Ambulatory Visit: Payer: Self-pay

## 2021-01-12 ENCOUNTER — Ambulatory Visit: Payer: Self-pay | Admitting: Physician Assistant

## 2021-01-12 VITALS — BP 159/111 | HR 84 | Temp 98.2°F | Resp 18 | Ht 71.0 in | Wt 179.0 lb

## 2021-01-12 DIAGNOSIS — F1011 Alcohol abuse, in remission: Secondary | ICD-10-CM

## 2021-01-12 DIAGNOSIS — J302 Other seasonal allergic rhinitis: Secondary | ICD-10-CM

## 2021-01-12 DIAGNOSIS — I10 Essential (primary) hypertension: Secondary | ICD-10-CM

## 2021-01-12 DIAGNOSIS — Z125 Encounter for screening for malignant neoplasm of prostate: Secondary | ICD-10-CM

## 2021-01-12 DIAGNOSIS — Z6824 Body mass index (BMI) 24.0-24.9, adult: Secondary | ICD-10-CM

## 2021-01-12 DIAGNOSIS — R972 Elevated prostate specific antigen [PSA]: Secondary | ICD-10-CM

## 2021-01-12 DIAGNOSIS — M1990 Unspecified osteoarthritis, unspecified site: Secondary | ICD-10-CM

## 2021-01-12 DIAGNOSIS — E559 Vitamin D deficiency, unspecified: Secondary | ICD-10-CM

## 2021-01-12 MED ORDER — HYDROCHLOROTHIAZIDE 25 MG PO TABS
25.0000 mg | ORAL_TABLET | Freq: Every day | ORAL | 1 refills | Status: DC
  Filled 2021-01-12: qty 30, 30d supply, fill #0

## 2021-01-12 MED ORDER — CETIRIZINE HCL 10 MG PO TABS
10.0000 mg | ORAL_TABLET | Freq: Every day | ORAL | 11 refills | Status: AC
  Filled 2021-01-12: qty 30, 30d supply, fill #0

## 2021-01-12 NOTE — Progress Notes (Signed)
Established Patient Office Visit  Subjective:  Patient ID: Richard George, male    DOB: 03/10/1960  Age: 61 y.o. MRN: [id number]  CC:  Chief Complaint  Patient presents with   Medication Refill    HTN    HPI Richard MoralesPhillip George presents for hypertension management. He was started on amlodipine after a hospitalization in October 2020 .  The patient states that he has been checking his blood pressure at home regularly. He is unable to recall exact numbers, but states that the numbers have been high. He states he was on another blood pressure medication in the past, but that he didn't like it because it, "made my feet swell." He does not recall the name of the medication today.   The patient reports that the only other problem he has is some back pain. He states that he takes 3-5 ibuprofen every morning, and then more throughout the day. He states that his back pain is less when he is very active and moving about, but it is more noticeable when he is more sedentary.   He states that he is working on his health. He no longer drinks alcohol, and states he has been sober since April 2022. He states that he is also working on reducing his cigarette use, and drinking more water.  States that he did take one of the expired amlodipine today and has been taking them for the past week.  Denies hypertensive symptoms.  Past Medical History:  Diagnosis Date   Alcohol use 10/2018   Fistula, anal 1999   Hypertension 10/2018   Marijuana use 10/2018   Peripheral edema 10/2018   Tobacco use 10/2018    Past Surgical History:  Procedure Laterality Date   HEMORRHOID SURGERY      History reviewed. No pertinent family history.  Social History   Socioeconomic History   Marital status: Divorced    Spouse name: Not on file   Number of children: Not on file   Years of education: Not on file   Highest education level: Not on file  Occupational History   Not on file  Tobacco Use   Smoking status: Every  Day    Years: 41.00    Types: Cigarettes   Smokeless tobacco: Never  Vaping Use   Vaping Use: Never used  Substance and Sexual Activity   Alcohol use: Yes   Drug use: Yes    Types: Cocaine   Sexual activity: Not Currently  Other Topics Concern   Not on file  Social History Narrative   Not on file   Social Determinants of Health   Financial Resource Strain: Not on file  Food Insecurity: Not on file  Transportation Needs: Not on file  Physical Activity: Not on file  Stress: Not on file  Social Connections: Not on file  Intimate Partner Violence: Not on file    Outpatient Medications Prior to Visit  Medication Sig Dispense Refill   amLODipine (NORVASC) 5 MG tablet Take 5 mg by mouth daily.     lisinopril (ZESTRIL) 10 MG tablet Take 1 tablet (10 mg total) by mouth daily. 30 tablet 3   No facility-administered medications prior to visit.    No Known Allergies  ROS Review of Systems  Constitutional: Negative.   Eyes: Negative.   Respiratory:  Negative for cough and shortness of breath.   Cardiovascular:  Negative for chest pain.  Gastrointestinal:  Negative for constipation and diarrhea.  Endocrine: Negative.   Genitourinary: Negative.  Musculoskeletal:  Positive for back pain.  Skin: Negative.   Allergic/Immunologic: Negative.   Neurological: Negative.   Hematological: Negative.   Psychiatric/Behavioral: Negative.       Objective:    Physical Exam Constitutional:      General: He is not in acute distress.    Appearance: Normal appearance. He is not ill-appearing.  HENT:     Head: Normocephalic and atraumatic.     Right Ear: External ear normal.     Left Ear: External ear normal.     Nose: Nose normal.     Mouth/Throat:     Mouth: Mucous membranes are moist.     Pharynx: Oropharynx is clear.  Eyes:     General: No scleral icterus.    Conjunctiva/sclera: Conjunctivae normal.     Pupils: Pupils are equal, round, and reactive to light.  Cardiovascular:      Rate and Rhythm: Normal rate and regular rhythm.     Heart sounds: No murmur heard.   No friction rub. No gallop.  Pulmonary:     Effort: Pulmonary effort is normal.     Breath sounds: Normal breath sounds. No wheezing, rhonchi or rales.  Musculoskeletal:        General: Normal range of motion.     Cervical back: Normal range of motion and neck supple.  Skin:    General: Skin is warm and dry.  Neurological:     General: No focal deficit present.     Mental Status: He is alert and oriented to person, place, and time.  Psychiatric:        Mood and Affect: Mood normal.        Behavior: Behavior normal.        Thought Content: Thought content normal.        Judgment: Judgment normal.    BP (!) 159/111 (BP Location: Left Arm, Patient Position: Sitting, Cuff Size: Normal)    Pulse 84    Temp 98.2 F (36.8 C) (Oral)    Resp 18    Ht 5\' 11"  (1.803 m)    Wt 179 lb (81.2 kg)    SpO2 100%    BMI 24.97 kg/m  Wt Readings from Last 3 Encounters:  01/12/21 179 lb (81.2 kg)  10/15/18 212 lb 12.8 oz (96.5 kg)  10/10/18 214 lb 4.6 oz (97.2 kg)     Health Maintenance Due  Topic Date Due   COVID-19 Vaccine (1) Never done   Pneumococcal Vaccine 110-69 Years old (1 - PCV) Never done   TETANUS/TDAP  Never done   COLONOSCOPY (Pts 45-37yrs Insurance coverage will need to be confirmed)  Never done   Zoster Vaccines- Shingrix (1 of 2) Never done   INFLUENZA VACCINE  Never done    There are no preventive care reminders to display for this patient.  Lab Results  Component Value Date   TSH 2.800 10/15/2018   Lab Results  Component Value Date   WBC 13.3 (H) 10/09/2018   HGB 12.6 (L) 10/09/2018   HCT 36.6 (L) 10/09/2018   MCV 96.3 10/09/2018   PLT 159 10/09/2018   Lab Results  Component Value Date   NA 139 10/10/2018   K 3.7 10/10/2018   CO2 25 10/10/2018   GLUCOSE 125 (H) 10/10/2018   BUN 18 10/10/2018   CREATININE 0.93 10/10/2018   BILITOT 2.8 (H) 10/10/2018   ALKPHOS 82  10/10/2018   AST 276 (H) 10/10/2018   ALT 2,338 (H) 10/10/2018   PROT  4.5 (L) 10/10/2018   ALBUMIN 2.5 (L) 10/10/2018   CALCIUM 8.6 (L) 10/10/2018   ANIONGAP 9 10/10/2018   Lab Results  Component Value Date   CHOL 197 10/15/2018   Lab Results  Component Value Date   HDL 61 10/15/2018   Lab Results  Component Value Date   LDLCALC 121 (H) 10/15/2018   Lab Results  Component Value Date   TRIG 85 10/15/2018   Lab Results  Component Value Date   CHOLHDL 3.2 10/15/2018   Lab Results  Component Value Date   HGBA1C 5.6 10/15/2018      Assessment & Plan:   1. Essential hypertension Trial hydrochlorothiazide. Patient education given on medication administration and possible side effects. Continue with home blood pressure monitoring.   Patient given an appointment to establish care with Thad Ranger nurse practitioner at patient care on February 16, 2021. - CBC with Differential/Platelet - Comp. Metabolic Panel (12) - TSH - hydrochlorothiazide (HYDRODIURIL) 25 MG tablet; Take 1 tablet (25 mg total) by mouth daily.  Dispense: 30 tablet; Refill: 1  2. Alcohol abuse, in remission Patient has maintained remission for approximately 9 months. Encouragement given to continue.  3. Vitamin D deficiency Vitamin D level ordered due to patient's back and joint aches. Will trial course of Vitamin D if level is low.  - Vitamin D, 25-hydroxy  4. Arthritis Patient educated on appropriate Ibuprofen use and managing back/joint pain. Counseled to take only as needed. 400 mg in the morning okay, and reevaluate pain later in the day before taking additional Ibuprofen.   5. Seasonal allergies Due to patient's smoking history and recent pneumonia-like illness, encouraged to take cetirizine to help manage airway irritation. Smoking cessation discussed. Chest x-ray results reviewed with patient, who verbalized understanding of findings   6. Screening PSA (prostate specific antigen) Screening  PSA ordered per age appropriate screenings. - PSA    I have reviewed the patient's medical history (PMH, PSH, Social History, Family History, Medications, and allergies) , and have been updated if relevant. I spent 30 minutes reviewing chart and  face to face time with patient.    Meds ordered this encounter  Medications   hydrochlorothiazide (HYDRODIURIL) 25 MG tablet    Sig: Take 1 tablet (25 mg total) by mouth daily.    Dispense:  30 tablet    Refill:  1    Order Specific Question:   Supervising Provider    Answer:   Storm Frisk [[id number]]    Follow-up: Return in about 5 weeks (around 02/16/2021) for [facility].    Kasandra Knudsen Mayers, PA-C

## 2021-01-12 NOTE — Patient Instructions (Signed)
You are going to start taking hydrochlorothiazide 25 mg once daily in the morning to help with your blood pressure readings.  You will discontinue the amlodipine.  I do encourage you to check your blood pressure at home, keep a written log and have available for all office visits.  I do encourage you to increase your water intake, you should be drinking at least 64 ounces of water a day.  I do encourage you to work on reducing the amount of ibuprofen that you are using, you should never use more than 800 mg every 8 hours.  We will call you with today's lab results.  Richard Rad, PA-C Physician Assistant [facility] [email or web address]   How to Take Your Blood Pressure Blood pressure is a measurement of how strongly your blood is pressing against the walls of your arteries. Arteries are blood vessels that carry blood from your heart throughout your body. Your health care provider takes your blood pressure at each office visit. You can also take your own blood pressure at home with a blood pressure monitor. You may need to take your own blood pressure to: Confirm a diagnosis of high blood pressure (hypertension). Monitor your blood pressure over time. Make sure your blood pressure medicine is working. Supplies needed: Blood pressure monitor. Dining room chair to sit in. Table or desk. Small notebook and pencil or pen. How to prepare To get the most accurate reading, avoid the following for 30 minutes before you check your blood pressure: Drinking caffeine. Drinking alcohol. Eating. Smoking. Exercising. Five minutes before you check your blood pressure: Use the bathroom and urinate so that you have an empty bladder. Sit quietly in a dining room chair. Do not sit in a soft couch or an armchair. Do not talk. How to take your blood pressure To check your blood pressure, follow the instructions in the manual that came with your  blood pressure monitor. If you have a digital blood pressure monitor, the instructions may be as follows: Sit up straight in a chair. Place your feet on the floor. Do not cross your ankles or legs. Rest your left arm at the level of your heart on a table or desk or on the arm of a chair. Pull up your shirt sleeve. Wrap the blood pressure cuff around the upper part of your left arm, 1 inch (2.5 cm) above your elbow. It is best to wrap the cuff around bare skin. Fit the cuff snugly around your arm. You should be able to place only one finger between the cuff and your arm. Position the cord so that it rests in the bend of your elbow. Press the power button. Sit quietly while the cuff inflates and deflates. Read the digital reading on the monitor screen and write the numbers down (record them) in a notebook. Wait 2-3 minutes, then repeat the steps, starting at step 1. What does my blood pressure reading mean? A blood pressure reading consists of a higher number over a lower number. Ideally, your blood pressure should be below 120/80. The first ("top") number is called the systolic pressure. It is a measure of the pressure in your arteries as your heart beats. The second ("bottom") number is called the diastolic pressure. It is a measure of the pressure in your arteries as the heart relaxes. Blood pressure is classified into five stages. The following are the stages for adults who do not have a short-term serious illness or a chronic condition. Systolic pressure  and diastolic pressure are measured in a unit called mm Hg (millimeters of mercury).  Normal Systolic pressure: below 123456. Diastolic pressure: below 80. Elevated Systolic pressure: Q000111Q. Diastolic pressure: below 80. Hypertension stage 1 Systolic pressure: 0000000. Diastolic pressure: XX123456. Hypertension stage 2 Systolic pressure: XX123456 or above. Diastolic pressure: 90 or above. You can have elevated blood pressure or hypertension even  if only the systolic or only the diastolic number in your reading is higher than normal. Follow these instructions at home: Medicines Take over-the-counter and prescription medicines only as told by your health care provider. Tell your health care provider if you are having any side effects from blood pressure medicine. General instructions Check your blood pressure as often as recommended by your health care provider. Check your blood pressure at the same time every day. Take your monitor to the next appointment with your health care provider to make sure that: You are using it correctly. It provides accurate readings. Understand what your goal blood pressure numbers are. Keep all follow-up visits as told by your health care provider. This is important. General tips Your health care provider can suggest a reliable monitor that will meet your needs. There are several types of home blood pressure monitors. Choose a monitor that has an arm cuff. Do not choose a monitor that measures your blood pressure from your wrist or finger. Choose a cuff that wraps snugly around your upper arm. You should be able to fit only one finger between your arm and the cuff. You can buy a blood pressure monitor at most drugstores or online. Where to find more information American Heart Association: www.heart.org Contact a health care provider if: Your blood pressure is consistently high. Your blood pressure is suddenly low. Get help right away if: Your systolic blood pressure is higher than 180. Your diastolic blood pressure is higher than 120. Summary Blood pressure is a measurement of how strongly your blood is pressing against the walls of your arteries. A blood pressure reading consists of a higher number over a lower number. Ideally, your blood pressure should be below 120/80. Check your blood pressure at the same time every day. Avoid caffeine, alcohol, smoking, and exercise for 30 minutes prior to  checking your blood pressure. These agents can affect the accuracy of the blood pressure reading. This information is not intended to replace advice given to you by your health care provider. Make sure you discuss any questions you have with your health care provider. Document Revised: 10/28/2019 Document Reviewed: 12/12/2018 Elsevier Patient Education  2022 [employer].

## 2021-01-12 NOTE — Progress Notes (Signed)
Patient restarted Amlodipine 2 weeks ago. Patient has eaten today Patient denies pain at this time.

## 2021-01-13 LAB — CBC WITH DIFFERENTIAL/PLATELET
Basophils Absolute: 0.1 10*3/uL (ref 0.0–0.2)
Basos: 2 %
EOS (ABSOLUTE): 0.1 10*3/uL (ref 0.0–0.4)
Eos: 2 %
Hematocrit: 41.7 % (ref 37.5–51.0)
Hemoglobin: 13.8 g/dL (ref 13.0–17.7)
Immature Grans (Abs): 0 10*3/uL (ref 0.0–0.1)
Immature Granulocytes: 0 %
Lymphocytes Absolute: 2 10*3/uL (ref 0.7–3.1)
Lymphs: 33 %
MCH: 29.9 pg (ref 26.6–33.0)
MCHC: 33.1 g/dL (ref 31.5–35.7)
MCV: 91 fL (ref 79–97)
Monocytes Absolute: 0.4 10*3/uL (ref 0.1–0.9)
Monocytes: 6 %
Neutrophils Absolute: 3.4 10*3/uL (ref 1.4–7.0)
Neutrophils: 57 %
Platelets: 332 10*3/uL (ref 150–450)
RBC: 4.61 x10E6/uL (ref 4.14–5.80)
RDW: 12.7 % (ref 11.6–15.4)
WBC: 5.9 10*3/uL (ref 3.4–10.8)

## 2021-01-13 LAB — COMP. METABOLIC PANEL (12)
AST: 16 IU/L (ref 0–40)
Albumin/Globulin Ratio: 1.6 (ref 1.2–2.2)
Albumin: 4.1 g/dL (ref 3.8–4.9)
Alkaline Phosphatase: 79 IU/L (ref 44–121)
BUN/Creatinine Ratio: 10 (ref 9–20)
BUN: 10 mg/dL (ref 6–24)
Bilirubin Total: 0.4 mg/dL (ref 0.0–1.2)
Calcium: 9.1 mg/dL (ref 8.7–10.2)
Chloride: 101 mmol/L (ref 96–106)
Creatinine, Ser: 1.03 mg/dL (ref 0.76–1.27)
Globulin, Total: 2.6 g/dL (ref 1.5–4.5)
Glucose: 98 mg/dL (ref 70–99)
Potassium: 4.4 mmol/L (ref 3.5–5.2)
Sodium: 139 mmol/L (ref 134–144)
Total Protein: 6.7 g/dL (ref 6.0–8.5)
eGFR: 84 mL/min/{1.73_m2} (ref >59)

## 2021-01-13 LAB — PSA: Prostate Specific Ag, Serum: 4.7 ng/mL — ABNORMAL HIGH (ref 0.0–4.0)

## 2021-01-13 LAB — VITAMIN D 25 HYDROXY (VIT D DEFICIENCY, FRACTURES): Vit D, 25-Hydroxy: 28.5 ng/mL — ABNORMAL LOW (ref 30.0–100.0)

## 2021-01-13 LAB — TSH: TSH: 3.36 u[IU]/mL (ref 0.450–4.500)

## 2021-01-16 NOTE — Addendum Note (Signed)
Addended by: Roney Jaffe on: 01/16/2021 01:25 PM   Modules accepted: Orders

## 2021-01-18 ENCOUNTER — Telehealth: Payer: Self-pay | Admitting: *Deleted

## 2021-01-18 NOTE — Telephone Encounter (Signed)
-----   Message from Roney Jaffe, [place] sent at 01/16/2021  1:27 PM EST ----- Please call patient and let him know that his thyroid function, kidney and liver function are within normal limits, he does not show signs of anemia.  His prostate level is slightly elevated, I will start a referral for him to have follow-up with urology.  His vitamin D levels are slightly below normal limits, he should take 2000 units once daily, he will purchase this over-the-counter.

## 2021-01-18 NOTE — Telephone Encounter (Signed)
Patient verified DOB Patient is aware of labs and needing to take OTC 2000 units of vitamin d along with scheduling a visit with urology regarding elevated PSA.

## 2021-01-18 NOTE — Telephone Encounter (Signed)
MA UTR patient due to being at work and not answering mobile phone. LVM to return a phone call to [facility] at [phone].

## 2021-01-19 ENCOUNTER — Other Ambulatory Visit: Payer: Self-pay

## 2021-02-16 ENCOUNTER — Other Ambulatory Visit (HOSPITAL_COMMUNITY): Payer: Self-pay

## 2021-02-16 ENCOUNTER — Other Ambulatory Visit: Payer: Self-pay

## 2021-02-16 ENCOUNTER — Ambulatory Visit (INDEPENDENT_AMBULATORY_CARE_PROVIDER_SITE_OTHER): Payer: Self-pay | Admitting: Nurse Practitioner

## 2021-02-16 ENCOUNTER — Encounter: Payer: Self-pay | Admitting: Nurse Practitioner

## 2021-02-16 VITALS — BP 161/132 | HR 100 | Temp 98.4°F | Ht 71.0 in | Wt 188.4 lb

## 2021-02-16 DIAGNOSIS — Z13220 Encounter for screening for lipoid disorders: Secondary | ICD-10-CM

## 2021-02-16 DIAGNOSIS — Z0000 Encounter for general adult medical examination without abnormal findings: Secondary | ICD-10-CM

## 2021-02-16 DIAGNOSIS — R82998 Other abnormal findings in urine: Secondary | ICD-10-CM

## 2021-02-16 DIAGNOSIS — I161 Hypertensive emergency: Secondary | ICD-10-CM

## 2021-02-16 DIAGNOSIS — I10 Essential (primary) hypertension: Secondary | ICD-10-CM

## 2021-02-16 DIAGNOSIS — Z5320 Procedure and treatment not carried out because of patient's decision for unspecified reasons: Secondary | ICD-10-CM

## 2021-02-16 LAB — POCT URINALYSIS DIP (CLINITEK)
Bilirubin, UA: NEGATIVE
Glucose, UA: NEGATIVE mg/dL
Ketones, POC UA: NEGATIVE mg/dL
Nitrite, UA: NEGATIVE
POC PROTEIN,UA: NEGATIVE
Spec Grav, UA: 1.02 (ref 1.010–1.025)
Urobilinogen, UA: 0.2 E.U./dL
pH, UA: 6 (ref 5.0–8.0)

## 2021-02-16 LAB — GLUCOSE, POCT (MANUAL RESULT ENTRY): POC Glucose: 113 mg/dl — AB (ref 70–99)

## 2021-02-16 LAB — POCT GLYCOSYLATED HEMOGLOBIN (HGB A1C)
HbA1c POC (<> result, manual entry): 5.8 % (ref 4.0–5.6)
HbA1c, POC (controlled diabetic range): 5.8 % (ref 0.0–7.0)
HbA1c, POC (prediabetic range): 5.8 % (ref 5.7–6.4)
Hemoglobin A1C: 5.8 % — AB (ref 4.0–5.6)

## 2021-02-16 MED ORDER — LOSARTAN POTASSIUM-HCTZ 100-25 MG PO TABS
1.0000 | ORAL_TABLET | Freq: Every day | ORAL | 0 refills | Status: DC
  Filled 2021-02-16: qty 90, 90d supply, fill #0

## 2021-02-16 NOTE — Progress Notes (Signed)
[name], [place]  [address] Phone:  [phone]   Fax:  [phone]   Established Patient Office Visit  Subjective:  Patient ID: Richard George, male    DOB: 1960/01/03  Age: 61 y.o. MRN: [id number]  CC:  Chief Complaint  Patient presents with   Follow-up    Pt is here to re-establish care today and has not been seen in 2 years with PCP. Pt has no concerns or issues to discuss.    HPI [employer] presents for follow up. He  has a past medical history of Alcohol use (10/2018), Fistula, anal (1999), Hypertension (10/2018), Marijuana use (10/2018), Peripheral edema (10/2018), and Tobacco use (10/2018).  He has been lost to follow up. A former patient of NP [place]. He was on amlodipine and HCTZ. He had some swelling in his feet. He has thrown away with medication. He has declined the ED despite his BP being extremely elevated. He has refused blood test as well. He is willing to restart medication. He is sober for one year. 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 His father passed in April.  He feels well overall. He is taking ASA, vitamin D and OTC vitamins. He is a Engineer, manufacturing systems. He is a [place] and is not fearful of death.  Past Medical History:  Diagnosis Date   Alcohol use 10/2018   Fistula, anal 1999   Hypertension 10/2018   Marijuana use 10/2018   Peripheral edema 10/2018   Tobacco use 10/2018    Past Surgical History:  Procedure Laterality Date   HEMORRHOID SURGERY      Family History  Family history unknown: Yes    Social History   Socioeconomic History   Marital status: Divorced    Spouse name: Not on file   Number of children: Not on file   Years of education: Not on file   Highest education level: Not on file  Occupational History   Not on file  Tobacco Use   Smoking status: Every Day    Packs/day: 0.50    Years: 41.00    Pack years: 20.50    Types: Cigarettes   Smokeless tobacco: Never  Vaping Use    Vaping Use: Never used  Substance and Sexual Activity   Alcohol use: Not Currently   Drug use: Not Currently    Types: Cocaine   Sexual activity: Not Currently  Other Topics Concern   Not on file  Social History Narrative   Not on file   Social Determinants of Health   Financial Resource Strain: Not on file  Food Insecurity: Not on file  Transportation Needs: Not on file  Physical Activity: Not on file  Stress: Not on file  Social Connections: Not on file  Intimate Partner Violence: Not on file    Outpatient Medications Prior to Visit  Medication Sig Dispense Refill   cholecalciferol (VITAMIN D3) 25 MCG (1000 UNIT) tablet Take 1,000 Units by mouth in the morning and at bedtime.     hydrochlorothiazide (HYDRODIURIL) 25 MG tablet Take 1 tablet (25 mg total) by mouth daily. 30 tablet 1   amLODipine (NORVASC) 5 MG tablet Take 5 mg by mouth daily.     cetirizine (ZYRTEC) 10 MG tablet Take 1 tablet (10 mg total) by mouth daily. (Patient not taking: Reported on 02/16/2021) 30 tablet 11   No facility-administered medications prior to visit.    No Known Allergies  ROS  Review of Systems    Objective:    Physical Exam Constitutional:      General: He is not in acute distress.    Appearance: He is normal weight. He is not ill-appearing, toxic-appearing or diaphoretic.  Eyes:     Comments: Dark circle under eyes bilaterally   Cardiovascular:     Rate and Rhythm: Normal rate and regular rhythm.     Pulses: Normal pulses.     Heart sounds: Normal heart sounds.  Pulmonary:     Effort: Pulmonary effort is normal.     Breath sounds: Normal breath sounds.  Abdominal:     Palpations: Abdomen is soft.     Comments: hypoactive  Musculoskeletal:        General: Normal range of motion.     Cervical back: Normal range of motion.     Right lower leg: No edema.     Left lower leg: No edema.  Skin:    General: Skin is warm.     Capillary Refill: Capillary refill takes less than 2  seconds.     Comments: Right arrm growth with long hair  Neurological:     General: No focal deficit present.     Mental Status: He is alert and oriented to person, place, and time.  Psychiatric:        Mood and Affect: Mood normal.        Behavior: Behavior normal.   BP (!) 161/132 (BP Location: Left Arm, Cuff Size: Normal)    Pulse 100    Temp 98.4 F (36.9 C)    Ht 5' 11" (1.803 m)    Wt 188 lb 6.4 oz (85.5 kg)    SpO2 100%    BMI 26.28 kg/m  Wt Readings from Last 3 Encounters:  02/16/21 188 lb 6.4 oz (85.5 kg)  01/12/21 179 lb (81.2 kg)  10/15/18 212 lb 12.8 oz (96.5 kg)     There are no preventive care reminders to display for this patient.   There are no preventive care reminders to display for this patient.  Lab Results  Component Value Date   TSH 3.360 01/12/2021   Lab Results  Component Value Date   WBC 5.9 01/12/2021   HGB 13.8 01/12/2021   HCT 41.7 01/12/2021   MCV 91 01/12/2021   PLT 332 01/12/2021   Lab Results  Component Value Date   NA 139 01/12/2021   K 4.4 01/12/2021   CO2 25 10/10/2018   GLUCOSE 98 01/12/2021   BUN 10 01/12/2021   CREATININE 1.03 01/12/2021   BILITOT 0.4 01/12/2021   ALKPHOS 79 01/12/2021   AST 16 01/12/2021   ALT 2,338 (H) 10/10/2018   PROT 6.7 01/12/2021   ALBUMIN 4.1 01/12/2021   CALCIUM 9.1 01/12/2021   ANIONGAP 9 10/10/2018   EGFR 84 01/12/2021   Lab Results  Component Value Date   CHOL 197 10/15/2018   Lab Results  Component Value Date   HDL 61 10/15/2018   Lab Results  Component Value Date   LDLCALC 121 (H) 10/15/2018   Lab Results  Component Value Date   TRIG 85 10/15/2018   Lab Results  Component Value Date   CHOLHDL 3.2 10/15/2018   Lab Results  Component Value Date   HGBA1C 5.8 (A) 02/16/2021   HGBA1C 5.8 02/16/2021   HGBA1C 5.8 02/16/2021   HGBA1C 5.8 02/16/2021      Assessment & Plan:   Problem List Items Addressed This Visit  Cardiovascular and Mediastinum   Essential  hypertension - Primary Uncontrolled Started Losartan 100/50 mg daily Encouraged on going compliance with current medication regimen Encouraged home monitoring and recording BP <130/80 Eating a heart-healthy diet with less salt Encouraged regular physical activity     Relevant Medications   losartan-hydrochlorothiazide (HYZAAR) 100-25 MG tablet   Other Relevant Orders   Comp. Metabolic Panel (12)   Other Visit Diagnoses     Healthcare maintenance       Relevant Orders   HgB A1c (Completed)   Glucose (CBG) (Completed)   POCT URINALYSIS DIP (CLINITEK) (Completed)   Screening for lipid disorders       Relevant Orders   Lipid panel   Blood test declined       Hypertensive emergency     Discussed with patient the severity of uncontrolled HTN Declined ED    Relevant Medications   losartan-hydrochlorothiazide (HYZAAR) 100-25 MG tablet       Meds ordered this encounter  Medications   losartan-hydrochlorothiazide (HYZAAR) 100-25 MG tablet    Sig: Take 1 tablet by mouth daily.    Dispense:  90 tablet    Refill:  0    Order Specific Question:   Supervising Provider    Answer:   Tresa Garter [id number]    Follow-up: Return in about 6 weeks (around 03/30/2021) for Follow up HTN [id number].    Vevelyn Francois, NP

## 2021-02-16 NOTE — Addendum Note (Signed)
Addended by: Lindley Magnus L on: 02/16/2021 10:19 AM   Modules accepted: Orders

## 2021-02-16 NOTE — Patient Instructions (Signed)
Managing Your Hypertension Hypertension, also called high blood pressure, is when the force of the blood pressing against the walls of the arteries is too strong. Arteries are blood vessels that carry blood from your heart throughout your body. Hypertension forces the heart to work harder to pump blood and may cause the arteries to become narrow or stiff. Understanding blood pressure readings Your personal target blood pressure may vary depending on your medical conditions, your age, and other factors. A blood pressure reading includes a higher number over a lower number. Ideally, your blood pressure should be below 120/80. You should know that: The first, or top, number is called the systolic pressure. It is a measure of the pressure in your arteries as your heart beats. The second, or bottom number, is called the diastolic pressure. It is a measure of the pressure in your arteries as the heart relaxes. Blood pressure is classified into four stages. Based on your blood pressure reading, your health care provider may use the following stages to determine what type of treatment you need, if any. Systolic pressure and diastolic pressure are measured in a unit called mmHg. Normal Systolic pressure: below 120. Diastolic pressure: below 80. Elevated Systolic pressure: 120-129. Diastolic pressure: below 80. Hypertension stage 1 Systolic pressure: 130-139. Diastolic pressure: 80-89. Hypertension stage 2 Systolic pressure: 140 or above. Diastolic pressure: 90 or above. How can this condition affect me? Managing your hypertension is an important responsibility. Over time, hypertension can damage the arteries and decrease blood flow to important parts of the body, including the brain, heart, and kidneys. Having untreated or uncontrolled hypertension can lead to: A heart attack. A stroke. A weakened blood vessel (aneurysm). Heart failure. Kidney damage. Eye damage. Metabolic syndrome. Memory and  concentration problems. Vascular dementia. What actions can I take to manage this condition? Hypertension can be managed by making lifestyle changes and possibly by taking medicines. Your health care provider will help you make a plan to bring your blood pressure within a normal range. Nutrition  Eat a diet that is high in fiber and potassium, and low in salt (sodium), added sugar, and fat. An example eating plan is called the Dietary Approaches to Stop Hypertension (DASH) diet. To eat this way: Eat plenty of fresh fruits and vegetables. Try to fill one-half of your plate at each meal with fruits and vegetables. Eat whole grains, such as whole-wheat pasta, brown rice, or whole-grain bread. Fill about one-fourth of your plate with whole grains. Eat low-fat dairy products. Avoid fatty cuts of meat, processed or cured meats, and poultry with skin. Fill about one-fourth of your plate with lean proteins such as fish, chicken without skin, beans, eggs, and tofu. Avoid pre-made and processed foods. These tend to be higher in sodium, added sugar, and fat. Reduce your daily sodium intake. Most people with hypertension should eat less than 1,500 mg of sodium a day. Lifestyle  Work with your health care provider to maintain a healthy body weight or to lose weight. Ask what an ideal weight is for you. Get at least 30 minutes of exercise that causes your heart to beat faster (aerobic exercise) most days of the week. Activities may include walking, swimming, or biking. Include exercise to strengthen your muscles (resistance exercise), such as weight lifting, as part of your weekly exercise routine. Try to do these types of exercises for 30 minutes at least 3 days a week. Do not use any products that contain nicotine or tobacco, such as cigarettes, e-cigarettes,   and chewing tobacco. If you need help quitting, ask your health care provider. Control any long-term (chronic) conditions you have, such as high  cholesterol or diabetes. Identify your sources of stress and find ways to manage stress. This may include meditation, deep breathing, or making time for fun activities. Alcohol use Do not drink alcohol if: Your health care provider tells you not to drink. You are pregnant, may be pregnant, or are planning to become pregnant. If you drink alcohol: Limit how much you use to: 0-1 drink a day for women. 0-2 drinks a day for men. Be aware of how much alcohol is in your drink. In the [place] one drink equals one 12 oz bottle of beer (355 mL), one 5 oz glass of wine (148 mL), or one 1 oz glass of hard liquor (44 mL). Medicines Your health care provider may prescribe medicine if lifestyle changes are not enough to get your blood pressure under control and if: Your systolic blood pressure is 130 or higher. Your diastolic blood pressure is 80 or higher. Take medicines only as told by your health care provider. Follow the directions carefully. Blood pressure medicines must be taken as told by your health care provider. The medicine does not work as well when you skip doses. Skipping doses also puts you at risk for problems. Monitoring Before you monitor your blood pressure: Do not smoke, drink caffeinated beverages, or exercise within 30 minutes before taking a measurement. Use the bathroom and empty your bladder (urinate). Sit quietly for at least 5 minutes before taking measurements. Monitor your blood pressure at home as told by your health care provider. To do this: Sit with your back straight and supported. Place your feet flat on the floor. Do not cross your legs. Support your arm on a flat surface, such as a table. Make sure your upper arm is at heart level. Each time you measure, take two or three readings one minute apart and record the results. You may also need to have your blood pressure checked regularly by your health care provider. General information Talk with your health care  provider about your diet, exercise habits, and other lifestyle factors that may be contributing to hypertension. Review all the medicines you take with your health care provider because there may be side effects or interactions. Keep all visits as told by your health care provider. Your health care provider can help you create and adjust your plan for managing your high blood pressure. Where to find more information National [employer], [employer], [employer]: www.nhlbi.nih.gov American [employer] Association: www.heart.org Contact a health care provider if: You think you are having a reaction to medicines you have taken. You have repeated (recurrent) headaches. You feel dizzy. You have swelling in your ankles. You have trouble with your vision. Get help right away if: You develop a severe headache or confusion. You have unusual weakness or numbness, or you feel faint. You have severe pain in your chest or abdomen. You vomit repeatedly. You have trouble breathing. These symptoms may represent a serious problem that is an emergency. Do not wait to see if the symptoms will go away. Get medical help right away. Call your local emergency services (911 in the [place] Do not drive yourself to the hospital. Summary Hypertension is when the force of blood pumping through your arteries is too strong. If this condition is not controlled, it may put you at risk for serious complications. Your personal target blood pressure may vary depending on   your medical conditions, your age, and other factors. For most people, a normal blood pressure is less than 120/80. Hypertension is managed by lifestyle changes, medicines, or both. Lifestyle changes to help manage hypertension include losing weight, eating a healthy, low-sodium diet, exercising more, stopping smoking, and limiting alcohol. This information is not intended to replace advice given to you by your health care provider. Make sure you discuss any questions  you have with your health care provider. Document Revised: 01/05/2019 Document Reviewed: 11/18/2018 [employer] Patient Education  2022 [employer]  

## 2021-04-21 ENCOUNTER — Ambulatory Visit: Payer: Medicaid Other | Admitting: Nurse Practitioner

## 2021-05-16 ENCOUNTER — Other Ambulatory Visit: Payer: Self-pay | Admitting: Nurse Practitioner

## 2021-05-16 ENCOUNTER — Other Ambulatory Visit (HOSPITAL_COMMUNITY): Payer: Self-pay

## 2021-05-17 ENCOUNTER — Other Ambulatory Visit (HOSPITAL_COMMUNITY): Payer: Self-pay

## 2021-05-17 MED ORDER — LOSARTAN POTASSIUM-HCTZ 100-25 MG PO TABS
1.0000 | ORAL_TABLET | Freq: Every day | ORAL | 0 refills | Status: AC
  Filled 2021-05-17: qty 90, 90d supply, fill #0

## 2021-08-16 ENCOUNTER — Other Ambulatory Visit (HOSPITAL_COMMUNITY): Payer: Self-pay

## 2021-08-16 ENCOUNTER — Other Ambulatory Visit: Payer: Self-pay | Admitting: Nurse Practitioner

## 2021-08-17 ENCOUNTER — Other Ambulatory Visit (HOSPITAL_COMMUNITY): Payer: Self-pay

## 2021-08-18 ENCOUNTER — Other Ambulatory Visit (HOSPITAL_COMMUNITY): Payer: Self-pay

## 2021-09-07 ENCOUNTER — Other Ambulatory Visit: Payer: Self-pay | Admitting: Nurse Practitioner

## 2021-09-07 ENCOUNTER — Other Ambulatory Visit (HOSPITAL_COMMUNITY): Payer: Self-pay

## 2021-09-12 ENCOUNTER — Other Ambulatory Visit: Payer: Self-pay | Admitting: Nurse Practitioner

## 2021-09-12 ENCOUNTER — Other Ambulatory Visit (HOSPITAL_COMMUNITY): Payer: Self-pay

## 2021-09-13 ENCOUNTER — Other Ambulatory Visit (HOSPITAL_COMMUNITY): Payer: Self-pay

## 2022-04-30 ENCOUNTER — Telehealth: Payer: Self-pay

## 2022-04-30 NOTE — Telephone Encounter (Signed)
LVM for patient to call back. AS, CMA 

## 2023-03-17 IMAGING — CR DG CHEST 2V
2 series · 2 of 2 positions shown · non-contrast
Comparison: None.

CLINICAL DATA: Cough, fever, and chills for 2 weeks.

EXAM:
CHEST - 2 VIEW

[w chest pa]
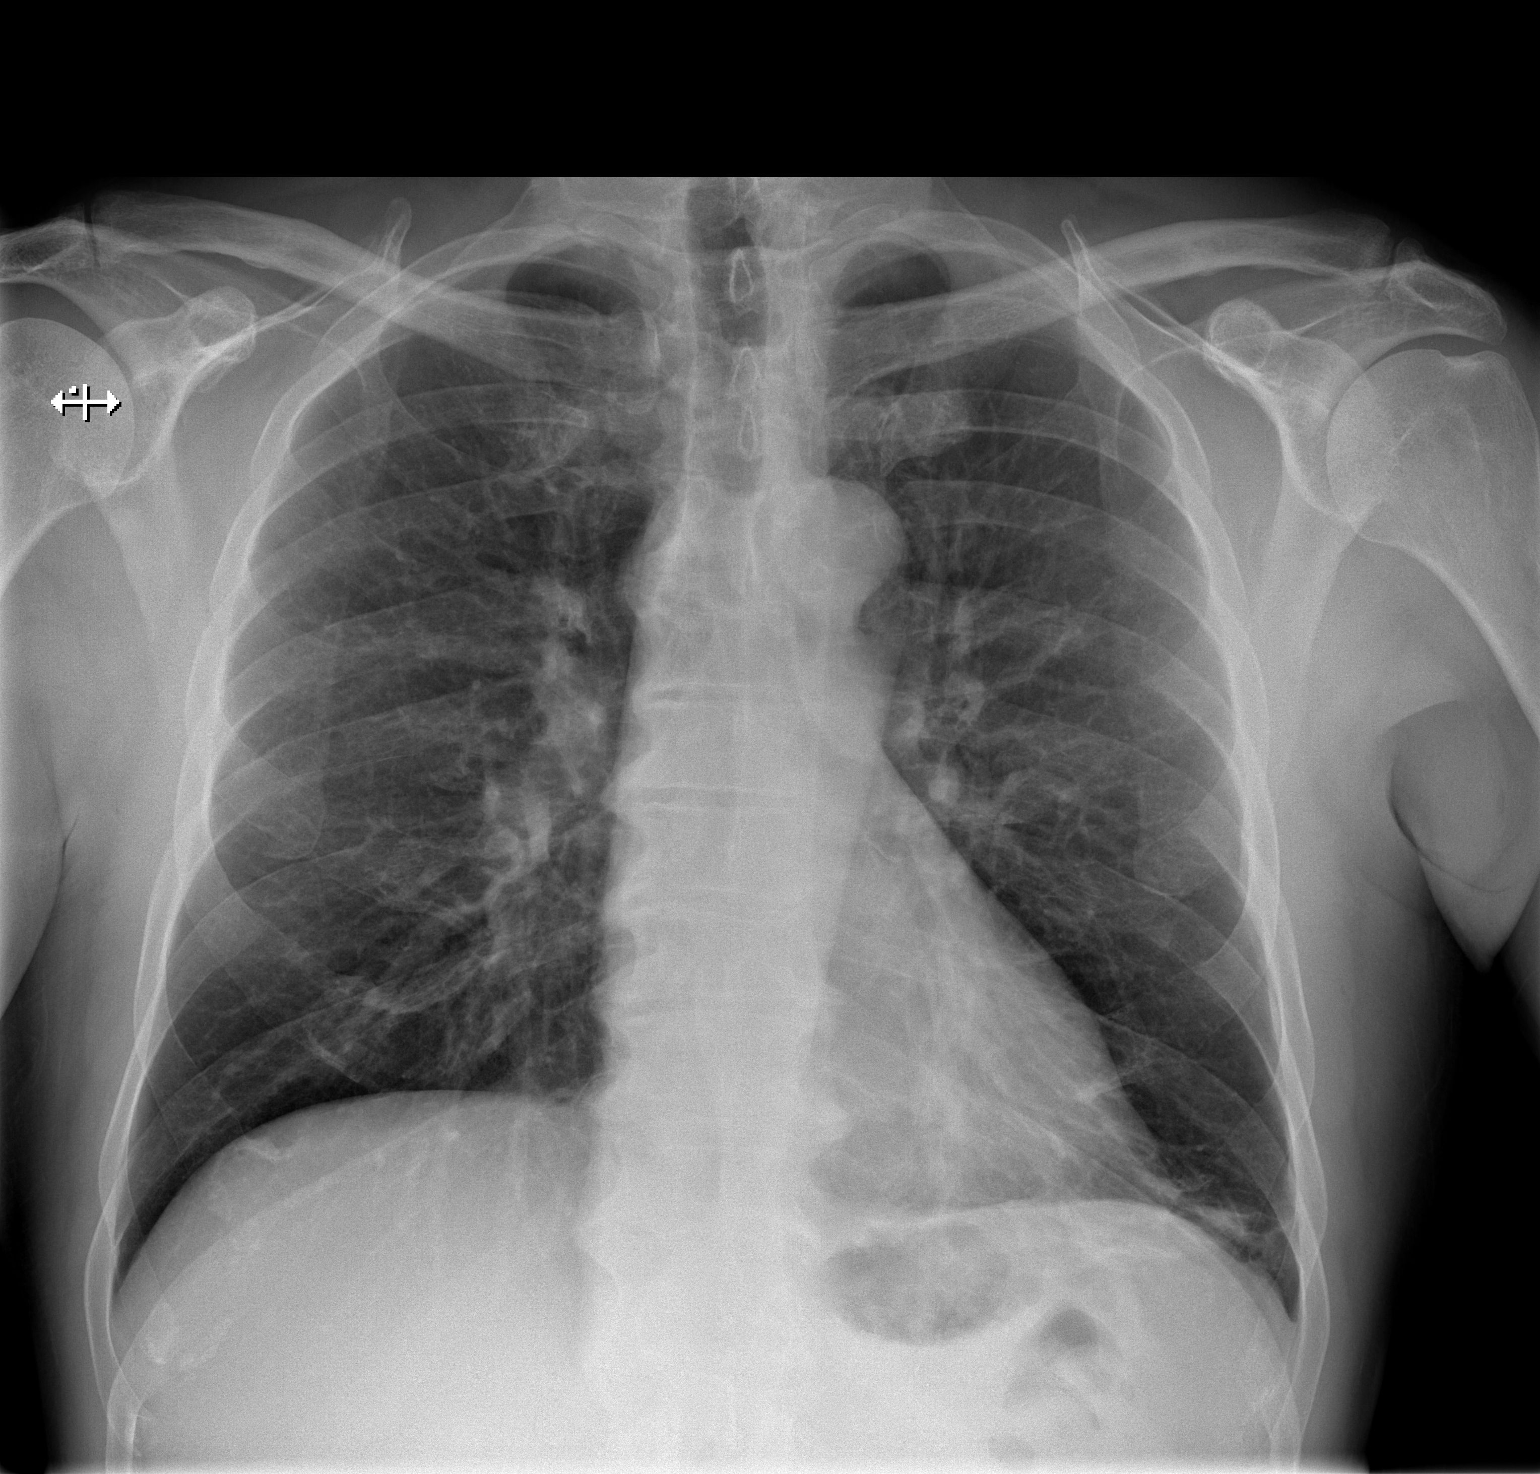

[w chest lat]
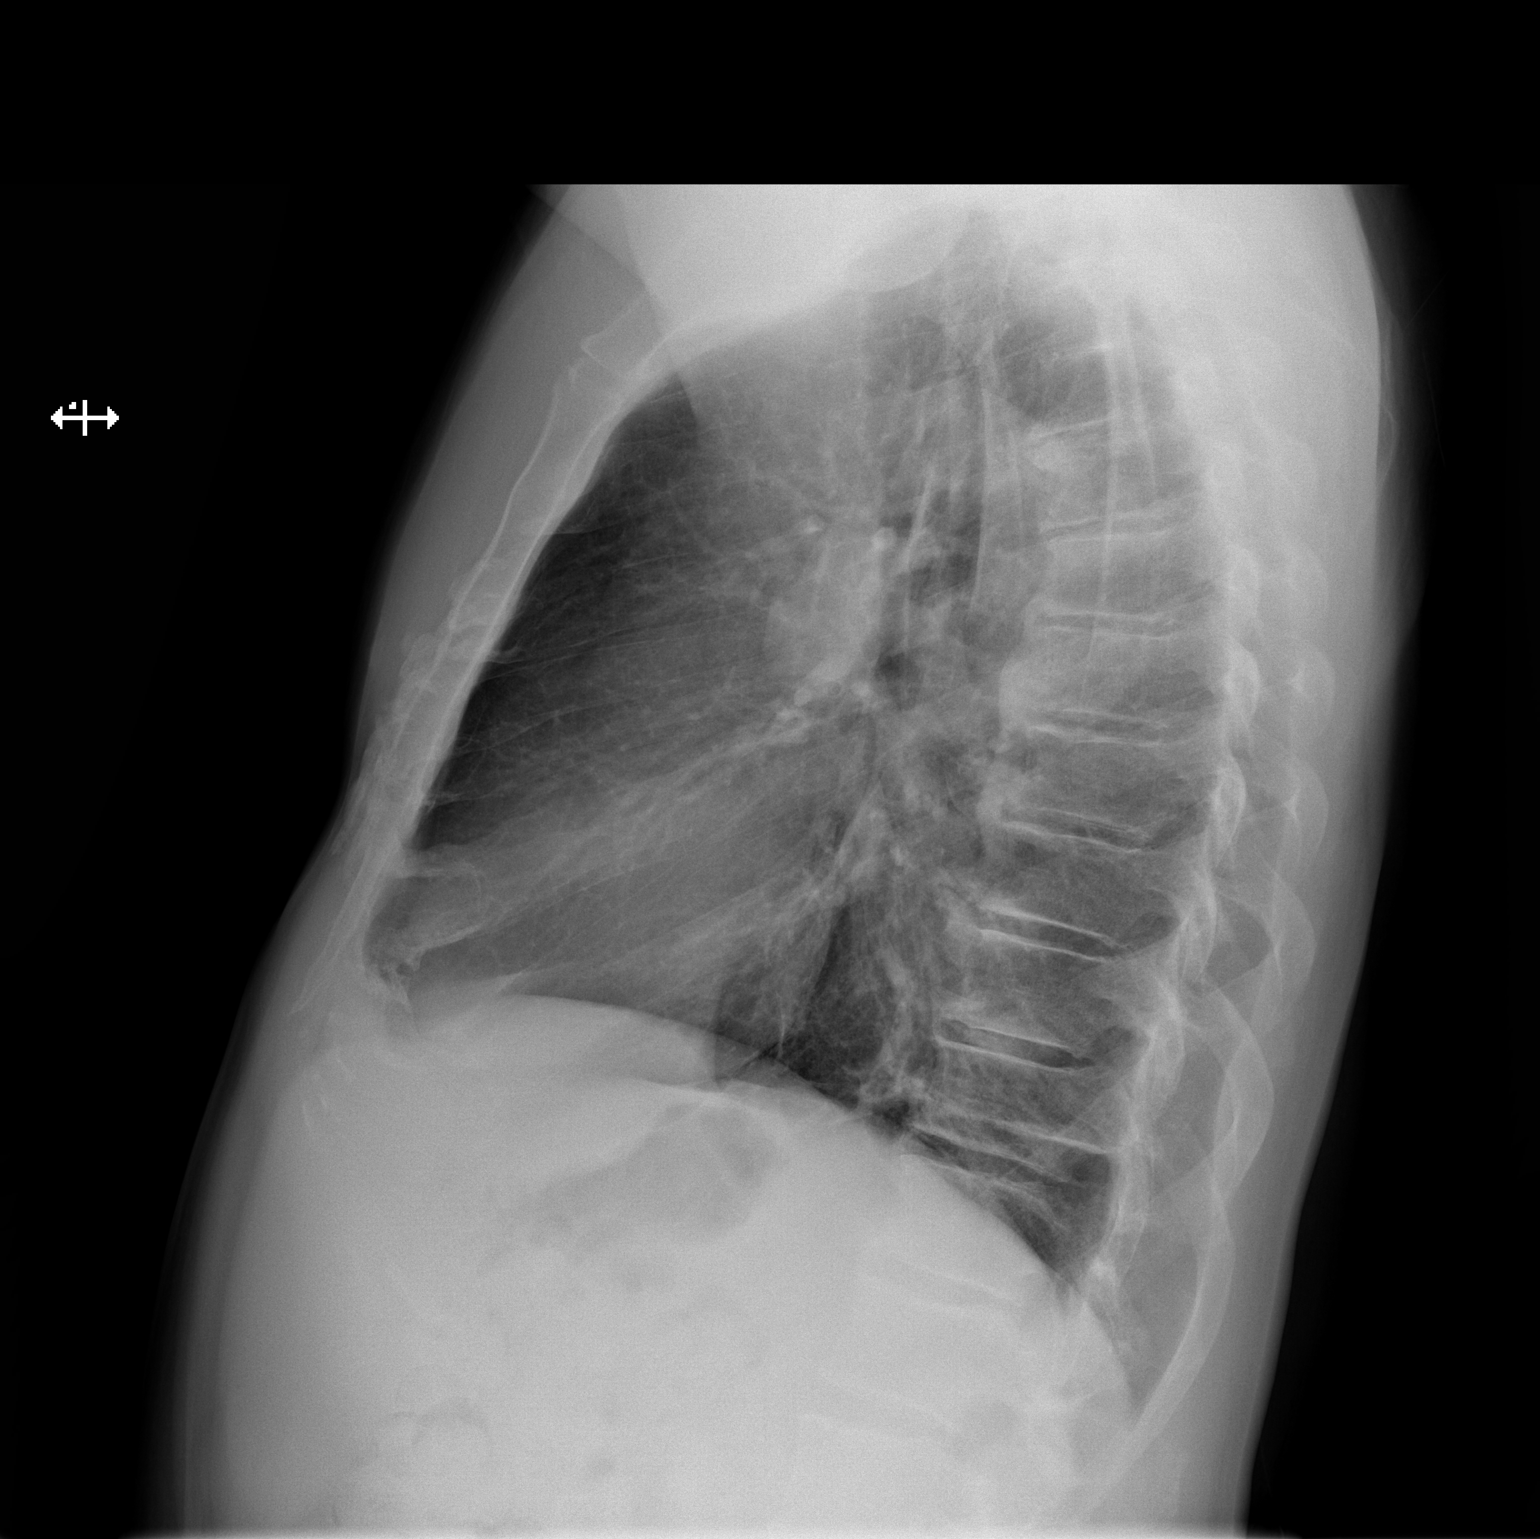

[2 of 2 positions shown; findings below may reference images not displayed]

FINDINGS: Heart size and pulmonary vascularity are normal. Slight infiltration
or atelectasis in the left lung base. Possible early pneumonia.
Bilateral peribronchial thickening with mild bronchiectasis
consistent with bronchitic changes. No pleural effusions. No
pneumothorax. Mediastinal contours appear intact. Degenerative
changes in the spine.
IMPRESSION: 1. Bronchitic changes are demonstrated, possibly acute or chronic.
2. Slight infiltration or atelectasis in the left lung base may
represent early pneumonia.
